# Patient Record
Sex: Female | Born: 1980
Health system: Southern US, Community
[De-identification: ages and names within clinical notes are randomized; demographics above are authoritative.]

## PROBLEM LIST (undated history)

## (undated) DIAGNOSIS — T8859XA Other complications of anesthesia, initial encounter: Secondary | ICD-10-CM

## (undated) DIAGNOSIS — R51 Headache: Secondary | ICD-10-CM

## (undated) DIAGNOSIS — R519 Headache, unspecified: Secondary | ICD-10-CM

## (undated) DIAGNOSIS — Z8751 Personal history of pre-term labor: Secondary | ICD-10-CM

## (undated) DIAGNOSIS — D649 Anemia, unspecified: Secondary | ICD-10-CM

## (undated) DIAGNOSIS — E7212 Methylenetetrahydrofolate reductase deficiency: Secondary | ICD-10-CM

## (undated) DIAGNOSIS — D6859 Other primary thrombophilia: Secondary | ICD-10-CM

## (undated) DIAGNOSIS — T4145XA Adverse effect of unspecified anesthetic, initial encounter: Secondary | ICD-10-CM

## (undated) DIAGNOSIS — T7840XA Allergy, unspecified, initial encounter: Secondary | ICD-10-CM

## (undated) DIAGNOSIS — O99280 Endocrine, nutritional and metabolic diseases complicating pregnancy, unspecified trimester: Secondary | ICD-10-CM

## (undated) DIAGNOSIS — O0990 Supervision of high risk pregnancy, unspecified, unspecified trimester: Secondary | ICD-10-CM

## (undated) DIAGNOSIS — O039 Complete or unspecified spontaneous abortion without complication: Secondary | ICD-10-CM

## (undated) HISTORY — DX: Supervision of high risk pregnancy, unspecified, unspecified trimester: O09.90

## (undated) HISTORY — PX: ARTHROSCOPIC REPAIR ACL: SUR80

## (undated) HISTORY — DX: Allergy, unspecified, initial encounter: T78.40XA

## (undated) HISTORY — DX: Complete or unspecified spontaneous abortion without complication: O03.9

## (undated) HISTORY — DX: Personal history of pre-term labor: Z87.51

---

## 2015-12-03 ENCOUNTER — Ambulatory Visit
Admission: EM | Admit: 2015-12-03 | Discharge: 2015-12-03 | Disposition: A | Discharge status: Home / Self Care | Payer: BLUE CROSS/BLUE SHIELD | Attending: Family Medicine | Admitting: Family Medicine

## 2015-12-03 ENCOUNTER — Encounter: Payer: Self-pay | Admitting: Emergency Medicine

## 2015-12-03 DIAGNOSIS — J018 Other acute sinusitis: Secondary | ICD-10-CM | POA: Diagnosis not present

## 2015-12-03 MED ORDER — AMOXICILLIN 500 MG PO CAPS: 500.0000 mg | ORAL_CAPSULE | Freq: Two times a day (BID) | ORAL | Status: AC

## 2015-12-03 MED ORDER — BUDESONIDE 32 MCG/ACT NA SUSP: 2.0000 | Freq: Every day | NASAL | Status: DC

## 2015-12-03 NOTE — ED Provider Notes (Signed)
CSN: [id number]     Arrival date & time 12/03/15  [id number] History   First MD Initiated Contact with Patient 12/03/15 0914     Chief Complaint  Patient presents with  . Facial Pain  . Nasal Congestion   (Consider location/radiation/quality/duration/timing/severity/associated sxs/prior Treatment) HPI: Patient presents today with symptoms of sinus congestion, sore throat and ear pressure. Patient states that she's had the symptoms for the last 3-4 days. She does have colored nasal drainage. She recently moved here from [place]. She denies any high fever, chest pain, shortness of breath, nausea, vomiting, abdominal pain, severe headache. She is currently breast-feeding. Her child has not been sick.  History reviewed. No pertinent past medical history. Past Surgical History  Procedure Laterality Date  . Cesarean section     History reviewed. No pertinent family history. Social History  Substance Use Topics  . Smoking status: Never Smoker   . Smokeless tobacco: None  . Alcohol Use: No   OB History    No data available     Review of Systems : Negative except mentioned above.   Allergies  Review of patient's allergies indicates no known allergies.  Home Medications   Prior to Admission medications   Not on File   Meds Ordered and Administered this Visit  Medications - No data to display  BP 136/98 mmHg  Pulse 94  Temp(Src) 97.4 F (36.3 C) (Tympanic)  Resp 16  Ht 5\' 6"  (1.676 m)  Wt 185 lb (83.915 kg)  BMI 29.87 kg/m2  SpO2 99% No data found.   Physical Exam   GENERAL: NAD HEENT: mild pharyngeal erythema, no exudate, no erythema of TMs, no cervical LAD, mild maxillary sinus tenderness bilaterally  RESP: CTA B CARD: RRR NEURO: CN II-XII grossly intact   ED Course  Procedures (including critical care time)  Labs Review Labs Reviewed - No data to display  Imaging Review No results found.     MDM  A/P: Sinusitis- will treat patient with Amoxicillin,  Claritin prn, Rhinocort Aqua prn, Tylenol prn, rest, hydration, seek medical attention if symptoms persist or worsen as discussed.    Jolene ProvostKirtida Mikael Debell, MD 12/03/15 1001

## 2015-12-03 NOTE — ED Notes (Signed)
Patient c/o sinus congestion and nasal congestion for the past 3 days.  Patient denies fevers.

## 2015-12-05 ENCOUNTER — Ambulatory Visit: Payer: Self-pay | Admitting: Family Medicine

## 2015-12-15 ENCOUNTER — Ambulatory Visit: Payer: Self-pay | Admitting: Internal Medicine

## 2015-12-17 ENCOUNTER — Encounter: Payer: Self-pay | Admitting: Internal Medicine

## 2015-12-17 ENCOUNTER — Ambulatory Visit (INDEPENDENT_AMBULATORY_CARE_PROVIDER_SITE_OTHER): Payer: BLUE CROSS/BLUE SHIELD | Admitting: Internal Medicine

## 2015-12-17 VITALS — BP 138/82 | HR 71 | Resp 16 | Ht 66.0 in | Wt 182.0 lb

## 2015-12-17 DIAGNOSIS — J3089 Other allergic rhinitis: Secondary | ICD-10-CM | POA: Diagnosis not present

## 2015-12-17 DIAGNOSIS — Z8751 Personal history of pre-term labor: Secondary | ICD-10-CM

## 2015-12-17 HISTORY — DX: Personal history of pre-term labor: Z87.51

## 2015-12-17 NOTE — Patient Instructions (Signed)
May take Claritin 10 mg daily.

## 2015-12-17 NOTE — Progress Notes (Signed)
    Date:  12/17/2015   Name:  Barbara GongSonya Schmidt   DOB:  [birth year]   MRN:  [id number]   Chief Complaint: Establish Care  New [place] from [place] for [place].  She is originally from [place].  She has no significant complaints today but needs to establish with PCP and high risk OB.  Allergies - she has notices some postnasal drainage and sneezing.  She is aware that she can take Claritin but has not tried it yet.  She is breast feeding her 3910 week old.  Pre-term labor/incompetent cervix/placental insufficiency - Pt reports cerclage and placental insufficiency with her first child who was born at 6136 weeks - after complete bedrest from 24 weeks. Her second child was born at 6532 weeks by C/S - that pregnancy was complicated by pre-term labor, clerclage and umbilical cord insufficiency.  Prior to these pregnancies, she had 2 miscarriages.  She is interested in one more child and wants to establish with a high risk OB clinic.   Review of Systems  Constitutional: Negative for fever, appetite change and fatigue.  HENT: Positive for sneezing.   Respiratory: Negative for cough, chest tightness, shortness of breath and wheezing.   Cardiovascular: Negative for chest pain, palpitations and leg swelling.  Gastrointestinal: Negative for abdominal pain.  Skin: Negative for rash and wound.  Neurological: Negative for dizziness and headaches.    Patient Active Problem List   Diagnosis Date Noted  . Environmental and seasonal allergies 12/17/2015  . H/O pre-term labor 12/17/2015    Prior to Admission medications   Not on File    No Known Allergies  Past Surgical History  Procedure Laterality Date  . Cesarean section  09/2015    1 vaginal and 1 c section  . Arthroscopic repair acl Right     high school     Social History  Substance Use Topics  . Smoking status: Never Smoker   . Smokeless tobacco: Never Used  . Alcohol Use: No    Medication list has been reviewed and updated.  Physical  Exam  Constitutional: She is oriented to person, place, and time. She appears well-developed. No distress.  HENT:  Head: Normocephalic and atraumatic.  Cardiovascular: Normal rate, regular rhythm and normal heart sounds.   Pulmonary/Chest: Effort normal and breath sounds normal. No respiratory distress.  Musculoskeletal: She exhibits no edema.  Neurological: She is alert and oriented to person, place, and time.  Skin: No rash noted.  Psychiatric: She has a normal mood and affect. Her behavior is normal. Thought content normal.  Nursing note and vitals reviewed.   BP 138/82 mmHg  Pulse 71  Resp 16  Ht 5\' 6"  (1.676 m)  Wt 182 lb (82.555 kg)  BMI 29.39 kg/m2  SpO2 97%  LMP 12/25/2014  Assessment and Plan: 1. Environmental and seasonal allergies May use Claritin as needed  2. H/O pre-term labor Recommend she establish with [facility] - if she needs a referral, she will call.   Bari EdwardLaura Mohmmad Saleeby, MD [facility]  12/17/2015

## 2016-01-13 DIAGNOSIS — Z3009 Encounter for other general counseling and advice on contraception: Secondary | ICD-10-CM | POA: Diagnosis not present

## 2016-01-13 DIAGNOSIS — Z87898 Personal history of other specified conditions: Secondary | ICD-10-CM | POA: Diagnosis not present

## 2016-01-13 DIAGNOSIS — R030 Elevated blood-pressure reading, without diagnosis of hypertension: Secondary | ICD-10-CM | POA: Diagnosis not present

## 2016-01-13 DIAGNOSIS — N883 Incompetence of cervix uteri: Secondary | ICD-10-CM | POA: Diagnosis not present

## 2016-01-19 ENCOUNTER — Ambulatory Visit: Payer: BLUE CROSS/BLUE SHIELD | Admitting: Internal Medicine

## 2016-01-19 ENCOUNTER — Encounter: Payer: Self-pay | Admitting: Internal Medicine

## 2016-01-19 ENCOUNTER — Ambulatory Visit (INDEPENDENT_AMBULATORY_CARE_PROVIDER_SITE_OTHER): Payer: BLUE CROSS/BLUE SHIELD | Admitting: Internal Medicine

## 2016-01-19 VITALS — BP 128/88 | HR 74 | Resp 16 | Ht 66.0 in | Wt 196.6 lb

## 2016-01-19 DIAGNOSIS — Z30011 Encounter for initial prescription of contraceptive pills: Secondary | ICD-10-CM | POA: Diagnosis not present

## 2016-01-19 DIAGNOSIS — Z021 Encounter for pre-employment examination: Secondary | ICD-10-CM | POA: Diagnosis not present

## 2016-01-19 MED ORDER — NORGESTIMATE-ETH ESTRADIOL 0.25-35 MG-MCG PO TABS: 1.0000 | ORAL_TABLET | Freq: Every day | ORAL | Status: DC

## 2016-01-19 NOTE — Progress Notes (Signed)
    Date:  01/19/2016   Name:  Barbara GongSonya Popoff   DOB:  1980-11-05   MRN:  [id number]   Chief Complaint: Employment Physical Patient has a form for school counselor position.  She had a Tdap in March after her baby was born.  She has had Hep B series but does not have the documentation with her.  She has records that show Rubella immunity from 2014. She has no complaints.  Currently weaning off of breast milk expressing (son won't nurse so she pumps). She is ready to resume OCP.  Her GYN appointment is in one month but does not want to wait.  She did not do well with Yaz or Ortho Tricyclen.  She liked Orthocyclen.   Review of Systems  Constitutional: Negative for fever, chills and fatigue.  Respiratory: Negative for cough, choking, chest tightness and shortness of breath.   Cardiovascular: Negative for chest pain, palpitations and leg swelling.  Gastrointestinal: Negative for abdominal pain.  Musculoskeletal: Negative for back pain.  Skin: Negative for color change and rash.  Neurological: Negative for dizziness and headaches.  Psychiatric/Behavioral: Negative for sleep disturbance.    Patient Active Problem List   Diagnosis Date Noted  . Blood pressure elevated without history of HTN 01/13/2016  . Environmental and seasonal allergies 12/17/2015  . H/O pre-term labor 12/17/2015    Prior to Admission medications   Medication Sig Start Date End Date Taking? Authorizing Provider  norgestimate-ethinyl estradiol (ORTHO-CYCLEN,SPRINTEC,PREVIFEM) 0.25-35 MG-MCG tablet Take 1 tablet by mouth daily. 01/19/16   Reubin MilanLaura H Gregg Holster, MD    No Known Allergies  Past Surgical History  Procedure Laterality Date  . Cesarean section  09/2015    1 vaginal and 1 c section  . Arthroscopic repair acl Right     high school     Social History  Substance Use Topics  . Smoking status: Never Smoker   . Smokeless tobacco: Never Used  . Alcohol Use: No     Medication list has been reviewed and  updated.   Physical Exam  Constitutional: She is oriented to person, place, and time. She appears well-developed. No distress.  HENT:  Head: Normocephalic and atraumatic.  Neck: Normal range of motion. Neck supple. No thyromegaly present.  Cardiovascular: Normal rate, regular rhythm and normal heart sounds.   Pulmonary/Chest: Effort normal and breath sounds normal. No respiratory distress. She has no wheezes. She has no rales.  Musculoskeletal: Normal range of motion. She exhibits no edema or tenderness.  Neurological: She is alert and oriented to person, place, and time.  Skin: Skin is warm and dry. No rash noted.  Psychiatric: She has a normal mood and affect. Her behavior is normal. Thought content normal.  Nursing note and vitals reviewed.   BP 128/88 mmHg  Pulse 74  Resp 16  Ht 5\' 6"  (1.676 m)  Wt 196 lb 9.6 oz (89.177 kg)  BMI 31.75 kg/m2  SpO2 99%  Assessment and Plan: 1. Physical exam, pre-employment Form completed TDap up to date She will provide Hep B documentation PPD not required  2. Encounter for initial prescription of contraceptive pills Follow up with GYN - norgestimate-ethinyl estradiol (ORTHO-CYCLEN,SPRINTEC,PREVIFEM) 0.25-35 MG-MCG tablet; Take 1 tablet by mouth daily.  Dispense: 1 Package; Refill: 11   Bari EdwardLaura Korra Christine, MD [facility]  01/19/2016

## 2016-01-19 NOTE — Patient Instructions (Signed)
Tdap Vaccine (Tetanus, Diphtheria and Pertussis): What You Need to Know 1. Why get vaccinated? Tetanus, diphtheria and pertussis are very serious diseases. Tdap vaccine can protect us from these diseases. And, Tdap vaccine given to pregnant women can protect newborn babies against pertussis. TETANUS (Lockjaw) is rare in the [place] today. It causes painful muscle tightening and stiffness, usually all over the body.  It can lead to tightening of muscles in the head and neck so you can't open your mouth, swallow, or sometimes even breathe. Tetanus kills about 1 out of 10 people who are infected even after receiving the best medical care. DIPHTHERIA is also rare in the [place] today. It can cause a thick coating to form in the back of the throat.  It can lead to breathing problems, heart failure, paralysis, and death. PERTUSSIS (Whooping Cough) causes severe coughing spells, which can cause difficulty breathing, vomiting and disturbed sleep.  It can also lead to weight loss, incontinence, and rib fractures. Up to 2 in 100 adolescents and 5 in 100 adults with pertussis are hospitalized or have complications, which could include pneumonia or death. These diseases are caused by bacteria. Diphtheria and pertussis are spread from person to person through secretions from coughing or sneezing. Tetanus enters the body through cuts, scratches, or wounds. Before vaccines, as many as 200,000 cases of diphtheria, 200,000 cases of pertussis, and hundreds of cases of tetanus, were reported in the [place] each year. Since vaccination began, reports of cases for tetanus and diphtheria have dropped by about 99% and for pertussis by about 80%. 2. Tdap vaccine Tdap vaccine can protect adolescents and adults from tetanus, diphtheria, and pertussis. One dose of Tdap is routinely given at age 11 or 12. People who did not get Tdap at that age should get it as soon as possible. Tdap is especially important  for healthcare professionals and anyone having close contact with a baby younger than 12 months. Pregnant women should get a dose of Tdap during every pregnancy, to protect the newborn from pertussis. Infants are most at risk for severe, life-threatening complications from pertussis. Another vaccine, called Td, protects against tetanus and diphtheria, but not pertussis. A Td booster should be given every 10 years. Tdap may be given as one of these boosters if you have never gotten Tdap before. Tdap may also be given after a severe cut or burn to prevent tetanus infection. Your doctor or the person giving you the vaccine can give you more information. Tdap may safely be given at the same time as other vaccines. 3. Some people should not get this vaccine  A person who has ever had a life-threatening allergic reaction after a previous dose of any diphtheria, tetanus or pertussis containing vaccine, OR has a severe allergy to any part of this vaccine, should not get Tdap vaccine. Tell the person giving the vaccine about any severe allergies.  Anyone who had coma or long repeated seizures within 7 days after a childhood dose of DTP or DTaP, or a previous dose of Tdap, should not get Tdap, unless a cause other than the vaccine was found. They can still get Td.  Talk to your doctor if you:  have seizures or another nervous system problem,  had severe pain or swelling after any vaccine containing diphtheria, tetanus or pertussis,  ever had a condition called Guillain-Barr Syndrome (GBS),  aren't feeling well on the day the shot is scheduled. 4. Risks With any medicine, including vaccines, there is   a chance of side effects. These are usually mild and go away on their own. Serious reactions are also possible but are rare. Most people who get Tdap vaccine do not have any problems with it. Mild problems following Tdap (Did not interfere with activities)  Pain where the shot was given (about 3 in 4  adolescents or 2 in 3 adults)  Redness or swelling where the shot was given (about 1 person in 5)  Mild fever of at least 100.4F (up to about 1 in 25 adolescents or 1 in 100 adults)  Headache (about 3 or 4 people in 10)  Tiredness (about 1 person in 3 or 4)  Nausea, vomiting, diarrhea, stomach ache (up to 1 in 4 adolescents or 1 in 10 adults)  Chills, sore joints (about 1 person in 10)  Body aches (about 1 person in 3 or 4)  Rash, swollen glands (uncommon) Moderate problems following Tdap (Interfered with activities, but did not require medical attention)  Pain where the shot was given (up to 1 in 5 or 6)  Redness or swelling where the shot was given (up to about 1 in 16 adolescents or 1 in 12 adults)  Fever over 102F (about 1 in 100 adolescents or 1 in 250 adults)  Headache (about 1 in 7 adolescents or 1 in 10 adults)  Nausea, vomiting, diarrhea, stomach ache (up to 1 or 3 people in 100)  Swelling of the entire arm where the shot was given (up to about 1 in 500). Severe problems following Tdap (Unable to perform usual activities; required medical attention)  Swelling, severe pain, bleeding and redness in the arm where the shot was given (rare). Problems that could happen after any vaccine:  People sometimes faint after a medical procedure, including vaccination. Sitting or lying down for about 15 minutes can help prevent fainting, and injuries caused by a fall. Tell your doctor if you feel dizzy, or have vision changes or ringing in the ears.  Some people get severe pain in the shoulder and have difficulty moving the arm where a shot was given. This happens very rarely.  Any medication can cause a severe allergic reaction. Such reactions from a vaccine are very rare, estimated at fewer than 1 in a million doses, and would happen within a few minutes to a few hours after the vaccination. As with any medicine, there is a very remote chance of a vaccine causing a serious  injury or death. The safety of vaccines is always being monitored. For more information, visit: www.cdc.gov/vaccinesafety/ 5. What if there is a serious problem? What should I look for?  Look for anything that concerns you, such as signs of a severe allergic reaction, very high fever, or unusual behavior.  Signs of a severe allergic reaction can include hives, swelling of the face and throat, difficulty breathing, a fast heartbeat, dizziness, and weakness. These would usually start a few minutes to a few hours after the vaccination. What should I do?  If you think it is a severe allergic reaction or other emergency that can't wait, call 9-1-1 or get the person to the nearest hospital. Otherwise, call your doctor.  Afterward, the reaction should be reported to the Vaccine Adverse Event Reporting System (VAERS). Your doctor might file this report, or you can do it yourself through the VAERS web site at www.vaers.hhs.gov, or by calling 1-800-822-7967. VAERS does not give medical advice.  6. The National Vaccine Injury Compensation Program The National Vaccine Injury Compensation Program (  VICP) is a federal program that was created to compensate people who may have been injured by certain vaccines. Persons who believe they may have been injured by a vaccine can learn about the program and about filing a claim by calling 1-800-338-2382 or visiting the VICP website at www.hrsa.gov/vaccinecompensation. There is a time limit to file a claim for compensation. 7. How can I learn more?  Ask your doctor. He or she can give you the vaccine package insert or suggest other sources of information.  Call your local or state health department.  Contact the Centers for Disease Control and Prevention (CDC):  Call 1-800-232-4636 (1-800-CDC-INFO) or  Visit CDC's website at www.cdc.gov/vaccines CDC Tdap Vaccine VIS (09/18/13)   This information is not intended to replace advice given to you by your health care  provider. Make sure you discuss any questions you have with your health care provider.   Document Released: 01/11/2012 Document Revised: 08/02/2014 Document Reviewed: 10/24/2013 Elsevier Interactive Patient Education 2016 Elsevier Inc.  

## 2016-02-24 ENCOUNTER — Other Ambulatory Visit: Payer: Self-pay

## 2016-02-24 DIAGNOSIS — Z30011 Encounter for initial prescription of contraceptive pills: Secondary | ICD-10-CM

## 2016-02-24 MED ORDER — NORGESTIMATE-ETH ESTRADIOL 0.25-35 MG-MCG PO TABS: 1.0000 | ORAL_TABLET | Freq: Every day | ORAL | 3 refills | Status: DC

## 2016-03-08 ENCOUNTER — Other Ambulatory Visit: Payer: Self-pay | Admitting: Internal Medicine

## 2016-03-08 DIAGNOSIS — Z30011 Encounter for initial prescription of contraceptive pills: Secondary | ICD-10-CM

## 2016-03-08 MED ORDER — NORGESTIMATE-ETH ESTRADIOL 0.25-35 MG-MCG PO TABS
1.0000 | ORAL_TABLET | Freq: Every day | ORAL | 3 refills | Status: DC
Start: 2016-03-08 — End: 2016-07-27

## 2016-04-18 ENCOUNTER — Encounter: Payer: Self-pay | Admitting: Emergency Medicine

## 2016-04-18 ENCOUNTER — Ambulatory Visit
Admission: EM | Admit: 2016-04-18 | Discharge: 2016-04-18 | Disposition: A | Discharge status: Home / Self Care | Payer: BLUE CROSS/BLUE SHIELD | Attending: Family Medicine | Admitting: Family Medicine

## 2016-04-18 DIAGNOSIS — N390 Urinary tract infection, site not specified: Secondary | ICD-10-CM | POA: Diagnosis not present

## 2016-04-18 LAB — URINALYSIS COMPLETE WITH MICROSCOPIC (ARMC ONLY)
BILIRUBIN URINE: NEGATIVE
GLUCOSE, UA: NEGATIVE mg/dL
Hgb urine dipstick: NEGATIVE
KETONES UR: NEGATIVE mg/dL
Leukocytes, UA: NEGATIVE
NITRITE: NEGATIVE
Protein, ur: NEGATIVE mg/dL
SPECIFIC GRAVITY, URINE: 1.025 (ref 1.005–1.030)
pH: 5.5 (ref 5.0–8.0)

## 2016-04-18 MED ORDER — CIPROFLOXACIN HCL 500 MG PO TABS: 500.0000 mg | ORAL_TABLET | Freq: Two times a day (BID) | ORAL | 0 refills | Status: DC

## 2016-04-18 NOTE — ED Triage Notes (Signed)
Patient c/o burning when urinating and urinary frequency for 2 days. Patient denies fevers.

## 2016-04-18 NOTE — ED Provider Notes (Signed)
[facility]    CSN: [id number] Arrival date & time: 04/18/16  1344  First Provider Contact:  None       History   Chief Complaint Chief Complaint  Patient presents with  . Dysuria    HPI Barbara Schmidt is a 35 y.o. female.   The history is provided by the patient.  Dysuria  Pain quality:  Burning and aching Pain severity:  Mild Onset quality:  Sudden Duration:  2 days Timing:  Constant Progression:  Worsening Chronicity:  New Recent urinary tract infections: no   Relieved by:  Nothing Ineffective treatments:  None tried Urinary symptoms: discolored urine and frequent urination   Urinary symptoms: no foul-smelling urine, no hematuria, no hesitancy and no bladder incontinence   Associated symptoms: abdominal pain (suprapubic)   Associated symptoms: no fever, no flank pain, no genital lesions, no nausea, no vaginal discharge and no vomiting   Risk factors: no hx of pyelonephritis, no hx of urolithiasis, no kidney transplant, not pregnant, no recurrent urinary tract infections, no renal cysts, no renal disease, not sexually active, no sexually transmitted infections and no single kidney     Past Medical History:  Diagnosis Date  . Allergy   . High-risk pregnancy   . Miscarriage    2  . Vaginal delivery     Patient Active Problem List   Diagnosis Date Noted  . Blood pressure elevated without history of HTN 01/13/2016  . Environmental and seasonal allergies 12/17/2015  . H/O pre-term labor 12/17/2015    Past Surgical History:  Procedure Laterality Date  . ARTHROSCOPIC REPAIR ACL Right    high school   . CESAREAN SECTION  09/2015   1 vaginal and 1 c section    OB History    No data available       Home Medications    Prior to Admission medications   Medication Sig Start Date End Date Taking? Authorizing Provider  ciprofloxacin (CIPRO) 500 MG tablet Take 1 tablet (500 mg total) by mouth every 12 (twelve) hours. 04/18/16   Payton Mccallum, MD    norgestimate-ethinyl estradiol (ORTHO-CYCLEN,SPRINTEC,PREVIFEM) 0.25-35 MG-MCG tablet Take 1 tablet by mouth daily. 03/08/16   Reubin Milan, MD    Family History Family History  Problem Relation Age of Onset  . Diabetes Maternal Grandmother   . Diabetes Paternal Uncle   . Kidney disease Paternal Uncle     Social History Social History  Substance Use Topics  . Smoking status: Never Smoker  . Smokeless tobacco: Never Used  . Alcohol use No     Allergies   Review of patient's allergies indicates no known allergies.   Review of Systems Review of Systems  Constitutional: Negative for fever.  Gastrointestinal: Positive for abdominal pain (suprapubic). Negative for nausea and vomiting.  Genitourinary: Positive for dysuria. Negative for flank pain and vaginal discharge.     Physical Exam Triage Vital Signs ED Triage Vitals  Enc Vitals Group     BP 04/18/16 1422 123/79     Pulse Rate 04/18/16 1422 68     Resp 04/18/16 1422 16     Temp 04/18/16 1422 97.3 F (36.3 C)     Temp Source 04/18/16 1422 Tympanic     SpO2 04/18/16 1422 100 %     Weight 04/18/16 1423 185 lb (83.9 kg)     Height 04/18/16 1423 5\' 6"  (1.676 m)     Head Circumference --      Peak Flow --  Pain Score 04/18/16 1424 5     Pain Loc --      Pain Edu? --      Excl. in GC? --    No data found.   Updated Vital Signs BP 123/79 (BP Location: Right Arm)   Pulse 68   Temp 97.3 F (36.3 C) (Tympanic)   Resp 16   Ht 5\' 6"  (1.676 m)   Wt 185 lb (83.9 kg)   LMP 03/25/2016 (Approximate)   SpO2 100%   BMI 29.86 kg/m   Visual Acuity Right Eye Distance:   Left Eye Distance:   Bilateral Distance:    Right Eye Near:   Left Eye Near:    Bilateral Near:     Physical Exam  Constitutional: She appears well-developed and well-nourished. No distress.  Abdominal: Soft. Bowel sounds are normal. She exhibits no distension and no mass. There is tenderness (mild, suprapubic). There is no rebound and no  guarding. No hernia.  Skin: She is not diaphoretic.  Nursing note and vitals reviewed.    UC Treatments / Results  Labs (all labs ordered are listed, but only abnormal results are displayed) Labs Reviewed  URINALYSIS COMPLETEWITH MICROSCOPIC ([facility] ONLY) - Abnormal; Notable for the following:       Result Value   APPearance HAZY (*)    Bacteria, UA FEW (*)    Squamous Epithelial / LPF 6-30 (*)    All other components within normal limits  URINE CULTURE    EKG  EKG Interpretation None       Radiology No results found.  Procedures Procedures (including critical care time)  Medications Ordered in UC Medications - No data to display   Initial Impression / Assessment and Plan / UC Course  I have reviewed the triage vital signs and the nursing notes.  Pertinent labs & imaging results that were available during my care of the patient were reviewed by me and considered in my medical decision making (see chart for details).  Clinical Course      Final Clinical Impressions(s) / UC Diagnoses   Final diagnoses:  UTI (lower urinary tract infection)    New Prescriptions Discharge Medication List as of 04/18/2016  3:37 PM    START taking these medications   Details  ciprofloxacin (CIPRO) 500 MG tablet Take 1 tablet (500 mg total) by mouth every 12 (twelve) hours., Starting Sun 04/18/2016, Normal       1. Labs/x-ray results and diagnosis reviewed with patient/parent/guardian/family 2. rx as per orders above; reviewed possible side effects, interactions, risks and benefits  3. Recommend supportive treatment with increased fluids 4. Follow-up prn if symptoms worsen or don't improve   Payton Mccallumrlando Anddy Wingert, MD 04/20/16 2205

## 2016-04-20 ENCOUNTER — Telehealth: Payer: Self-pay

## 2016-04-20 LAB — URINE CULTURE: Culture: >=100000 — AB

## 2016-04-20 NOTE — Telephone Encounter (Signed)
Courtesy call back completed today for patients recent visit at [facility]. Patient did not answer, left message on voicemail to call back with any questions or concerns.   

## 2016-04-28 DIAGNOSIS — Z3169 Encounter for other general counseling and advice on procreation: Secondary | ICD-10-CM | POA: Diagnosis not present

## 2016-04-28 DIAGNOSIS — R300 Dysuria: Secondary | ICD-10-CM | POA: Diagnosis not present

## 2016-04-28 DIAGNOSIS — Z30011 Encounter for initial prescription of contraceptive pills: Secondary | ICD-10-CM | POA: Diagnosis not present

## 2016-05-22 ENCOUNTER — Ambulatory Visit
Admission: EM | Admit: 2016-05-22 | Discharge: 2016-05-22 | Disposition: A | Discharge status: Home / Self Care | Payer: BLUE CROSS/BLUE SHIELD | Attending: Family Medicine | Admitting: Family Medicine

## 2016-05-22 DIAGNOSIS — H6593 Unspecified nonsuppurative otitis media, bilateral: Secondary | ICD-10-CM

## 2016-05-22 DIAGNOSIS — J0101 Acute recurrent maxillary sinusitis: Secondary | ICD-10-CM | POA: Diagnosis not present

## 2016-05-22 DIAGNOSIS — J301 Allergic rhinitis due to pollen: Secondary | ICD-10-CM

## 2016-05-22 DIAGNOSIS — B001 Herpesviral vesicular dermatitis: Secondary | ICD-10-CM | POA: Diagnosis not present

## 2016-05-22 MED ORDER — LORATADINE 10 MG PO TABS: 10.0000 mg | ORAL_TABLET | Freq: Every day | ORAL | 0 refills | Status: DC

## 2016-05-22 MED ORDER — PENCICLOVIR 1 % EX CREA: 1.0000 "application " | TOPICAL_CREAM | CUTANEOUS | 0 refills | Status: DC

## 2016-05-22 MED ORDER — SALINE SPRAY 0.65 % NA SOLN: 2.0000 | NASAL | 0 refills | Status: DC

## 2016-05-22 MED ORDER — AMOXICILLIN 875 MG PO TABS: 875.0000 mg | ORAL_TABLET | Freq: Two times a day (BID) | ORAL | 0 refills | Status: AC

## 2016-05-22 MED ORDER — FLUTICASONE PROPIONATE 50 MCG/ACT NA SUSP: 1.0000 | Freq: Two times a day (BID) | NASAL | 0 refills | Status: DC

## 2016-05-22 MED ORDER — IBUPROFEN 800 MG PO TABS: 800.0000 mg | ORAL_TABLET | Freq: Three times a day (TID) | ORAL | 0 refills | Status: DC

## 2016-05-22 NOTE — ED Provider Notes (Signed)
CSN: [id number]     Arrival date & time 05/22/16  4098 History   First MD Initiated Contact with Patient 05/22/16 [phone]     Chief Complaint  Patient presents with  . Cough    ear pain, sinus infection, HA mild, nasal drainage sore throat onset 3 days pt has Hx of less white cell count as per patient   (Consider location/radiation/quality/duration/timing/severity/associated sxs/prior Treatment) Married african [place] female here for sinus infection, sore throat, nasal congestion.  Last seen by Dr Allena Katz May 2017 for sinus infection amoxicillin worked well.  Hasn't been taking anything for allergies, son sick also 79 month old nasal discharge x 2 months.  Works as Veterinary surgeon with high school students.  Also requesting refill Denavir topical ran out and having symptoms of cold sore right upper lip.  "Typically happens if I have runny nose"  Has neti pot at home not currently using.  Not taking any allergy medications.    PCM Dr Judithann Graves      Past Medical History:  Diagnosis Date  . Allergy   . High-risk pregnancy   . Miscarriage    2  . Vaginal delivery    Past Surgical History:  Procedure Laterality Date  . ARTHROSCOPIC REPAIR ACL Right    high school   . CESAREAN SECTION  09/2015   1 vaginal and 1 c section   Family History  Problem Relation Age of Onset  . Diabetes Maternal Grandmother   . Diabetes Paternal Uncle   . Kidney disease Paternal Uncle    Social History  Substance Use Topics  . Smoking status: Never Smoker  . Smokeless tobacco: Never Used  . Alcohol use No   OB History    No data available     Review of Systems  Constitutional: Negative for activity change, appetite change, chills, diaphoresis, fatigue, fever and unexpected weight change.  HENT: Positive for congestion, postnasal drip and sinus pressure. Negative for dental problem, drooling, ear discharge, ear pain, facial swelling, hearing loss, mouth sores, nosebleeds, rhinorrhea, sneezing, sore throat,  tinnitus, trouble swallowing and voice change.   Eyes: Negative for photophobia, pain, discharge, redness, itching and visual disturbance.  Respiratory: Negative for cough, choking, chest tightness, shortness of breath, wheezing and stridor.   Cardiovascular: Negative for chest pain, palpitations and leg swelling.  Gastrointestinal: Negative for abdominal distention, abdominal pain, blood in stool, constipation, diarrhea, nausea and vomiting.  Endocrine: Negative for cold intolerance and heat intolerance.  Genitourinary: Negative for difficulty urinating, dysuria and hematuria.  Musculoskeletal: Negative for arthralgias, back pain, gait problem, joint swelling, myalgias, neck pain and neck stiffness.  Skin: Negative for color change, pallor, rash and wound.  Allergic/Immunologic: Positive for environmental allergies. Negative for food allergies.  Neurological: Negative for dizziness, tremors, seizures, syncope, facial asymmetry, speech difficulty, weakness, light-headedness, numbness and headaches.  Hematological: Negative for adenopathy. Does not bruise/bleed easily.  Psychiatric/Behavioral: Negative for agitation, behavioral problems, confusion and sleep disturbance.    Allergies  Review of patient's allergies indicates no known allergies.  Home Medications   Prior to Admission medications   Medication Sig Start Date End Date Taking? Authorizing Provider  norgestimate-ethinyl estradiol (ORTHO-CYCLEN,SPRINTEC,PREVIFEM) 0.25-35 MG-MCG tablet Take 1 tablet by mouth daily. 03/08/16  Yes Reubin Milan, MD  amoxicillin (AMOXIL) 875 MG tablet Take 1 tablet (875 mg total) by mouth 2 (two) times daily. 05/22/16 06/01/16  Barbaraann Barthel, NP  fluticasone (FLONASE) 50 MCG/ACT nasal spray Place 1 spray into both nostrils 2 (two) times daily.  05/22/16   Barbaraann Barthelina A Betancourt, NP  ibuprofen (ADVIL,MOTRIN) 800 MG tablet Take 1 tablet (800 mg total) by mouth 3 (three) times daily. 05/22/16   Barbaraann Barthelina A  Betancourt, NP  loratadine (CLARITIN) 10 MG tablet Take 1 tablet (10 mg total) by mouth daily. 05/22/16 06/21/16  Barbaraann Barthelina A Betancourt, NP  penciclovir (DENAVIR) 1 % cream Apply 1 application topically every 2 (two) hours. 05/22/16   Barbaraann Barthelina A Betancourt, NP  sodium chloride (OCEAN) 0.65 % SOLN nasal spray Place 2 sprays into both nostrils every 2 (two) hours while awake. 05/22/16 06/21/16  Barbaraann Barthelina A Betancourt, NP   Meds Ordered and Administered this Visit  Medications - No data to display  BP 127/76 (BP Location: Right Arm)   Pulse 83   Temp 98.3 F (36.8 C) (Oral)   Resp 16   Ht 5\' 6"  (1.676 m)   Wt 185 lb (83.9 kg)   SpO2 100%   BMI 29.86 kg/m  No data found.   Physical Exam  Constitutional: She is oriented to person, place, and time. Vital signs are normal. She appears well-developed and well-nourished. She is active and cooperative.  Non-toxic appearance. She does not have a sickly appearance. She does not appear ill. No distress.  HENT:  Head: Normocephalic and atraumatic.  Right Ear: Hearing, external ear and ear canal normal. A middle ear effusion is present.  Left Ear: Hearing, external ear and ear canal normal. A middle ear effusion is present.  Nose: Mucosal edema and rhinorrhea present. No nose lacerations, sinus tenderness, nasal deformity, septal deviation or nasal septal hematoma. No epistaxis.  No foreign bodies. Right sinus exhibits maxillary sinus tenderness and frontal sinus tenderness. Left sinus exhibits maxillary sinus tenderness and frontal sinus tenderness.  Mouth/Throat: Uvula is midline and mucous membranes are normal. Mucous membranes are not pale, not dry and not cyanotic. She does not have dentures. No oral lesions. No trismus in the jaw. Normal dentition. No dental abscesses, uvula swelling, lacerations or dental caries. Posterior oropharyngeal edema and posterior oropharyngeal erythema present. No oropharyngeal exudate or tonsillar abscesses. Tonsils are 1+ on the  right. Tonsils are 1+ on the left. No tonsillar exudate.  Cobblestoning posterior pharynx; tonsils 1+/4 bilaterally erythema no exudate; macular erythema oropharynx; bilateral allergic shiners; bilateral nasal turbinates edema/erythema/white discharge; bilateral TMs air fluid level clear  Eyes: Conjunctivae, EOM and lids are normal. Pupils are equal, round, and reactive to light. Right eye exhibits no chemosis, no discharge, no exudate and no hordeolum. No foreign body present in the right eye. Left eye exhibits no chemosis, no discharge, no exudate and no hordeolum. No foreign body present in the left eye. Right conjunctiva is not injected. Right conjunctiva has no hemorrhage. Left conjunctiva is not injected. Left conjunctiva has no hemorrhage. No scleral icterus. Right eye exhibits normal extraocular motion and no nystagmus. Left eye exhibits normal extraocular motion and no nystagmus. Right pupil is round and reactive. Left pupil is round and reactive. Pupils are equal.  Neck: Trachea normal, normal range of motion and phonation normal. Neck supple. No tracheal tenderness, no spinous process tenderness and no muscular tenderness present. No neck rigidity. No tracheal deviation, no edema, no erythema and normal range of motion present. No thyroid mass and no thyromegaly present.  Cardiovascular: Normal rate, regular rhythm, S1 normal, S2 normal, normal heart sounds and intact distal pulses.  PMI is not displaced.  Exam reveals no gallop and no friction rub.   No murmur heard. Pulmonary/Chest: Effort normal and  breath sounds normal. No accessory muscle usage or stridor. No respiratory distress. She has no decreased breath sounds. She has no wheezes. She has no rhonchi. She has no rales. She exhibits no tenderness.  Abdominal: Soft. She exhibits no distension.  Musculoskeletal: Normal range of motion. She exhibits no edema or tenderness.       Right shoulder: Normal.       Left shoulder: Normal.        Right hip: Normal.       Left hip: Normal.       Right knee: Normal.       Left knee: Normal.       Cervical back: Normal.       Right hand: Normal.       Left hand: Normal.  Lymphadenopathy:       Head (right side): No submental, no submandibular, no tonsillar, no preauricular, no posterior auricular and no occipital adenopathy present.       Head (left side): No submental, no submandibular, no tonsillar, no preauricular, no posterior auricular and no occipital adenopathy present.    She has no cervical adenopathy.       Right cervical: No superficial cervical, no deep cervical and no posterior cervical adenopathy present.      Left cervical: No superficial cervical, no deep cervical and no posterior cervical adenopathy present.  Neurological: She is alert and oriented to person, place, and time. She has normal strength. She is not disoriented. She displays no atrophy and no tremor. No cranial nerve deficit or sensory deficit. She exhibits normal muscle tone. She displays no seizure activity. Coordination and gait normal. GCS eye subscore is 4. GCS verbal subscore is 5. GCS motor subscore is 6.  Skin: Skin is warm, dry and intact. Capillary refill takes less than 2 seconds. No abrasion, no bruising, no burn, no ecchymosis, no laceration, no lesion, no petechiae and no rash noted. She is not diaphoretic. No cyanosis or erythema. No pallor. Nails show no clubbing.  Psychiatric: She has a normal mood and affect. Her speech is normal and behavior is normal. Judgment and thought content normal. She is not actively hallucinating. Cognition and memory are normal. She is attentive.  Nursing note and vitals reviewed.   Urgent Care Course   Clinical Course    Procedures (including critical care time)  Labs Review Labs Reviewed - No data to display  Imaging Review No results found.      MDM   1. Acute recurrent maxillary sinusitis   2. Recurrent cold sores   3. Acute nonseasonal  allergic rhinitis due to pollen   4. Otitis media with effusion, bilateral    Patient may use normal saline nasal spray as needed.  Consider antihistamine (claritin 10mg  po daily) or nasal steroid use flonase 1 spray each nostril BID.  Avoid triggers if possible.  Shower prior to bedtime if exposed to triggers.  If allergic dust/dust mites recommend mattress/pillow covers/encasements; washing linens, vacuuming, sweeping, dusting weekly.  Call or return to clinic as needed if these symptoms worsen or fail to improve as anticipated.   Exitcare handout on allergic rhinitis given to patient.  Patient verbalized understanding of instructions, agreed with plan of care and had no further questions at this time.  P2:  Avoidance and hand washing.  Supportive treatment.   No evidence of invasive bacterial infection, non toxic and well hydrated.  This is most likely self limiting viral infection.  I do not see where any further  testing or imaging is necessary at this time.   I will suggest supportive care, rest, good hygiene and encourage the patient to take adequate fluids.  The patient is to return to clinic or EMERGENCY ROOM if symptoms worsen or change significantly e.g. ear pain, fever, purulent discharge from ears or bleeding.  Exitcare handout on otitis media with effusion given to patient.  Patient verbalized agreement and understanding of treatment plan.      Suspect Viral illness: no evidence of invasive bacterial infection, non toxic and well hydrated.  This is most likely self limiting viral infection.  I do not see where any further testing or imaging is necessary at this time.   I will suggest supportive care, rest, good hygiene and encourage the patient to take adequate fluids.  Does not require work excuse.   flonase 1 spray each nostril BID prn, nasal saline 1-2 sprays each nostril prn q2h, motrin 800mg  po TID prn.  Discussed honey with lemon and salt water gargles for comfort also.  The patient is to  return to clinic or EMERGENCY ROOM if symptoms worsen or change significantly e.g. fever, lethargy, SOB, wheezing.  Exitcare handout on viral illness given to patient.  Patient verbalized agreement and understanding of treatment plan.     Neti pot or saline BID.  Restart flonase 1 spray each nostril BID, saline 2 sprays each nostril q2h prn congestion.  If no improvement with 48 hours of saline and flonase use start amoxicillin 875mg  po BID x 10 days.  Rx given.  No evidence of systemic bacterial infection, non toxic and well hydrated.  I do not see where any further testing or imaging is necessary at this time.   I will suggest supportive care, rest, good hygiene and encourage the patient to take adequate fluids.  The patient is to return to clinic or EMERGENCY ROOM if symptoms worsen or change significantly.  Exitcare handout on sinusitis given to patient.  Patient verbalized agreement and understanding of treatment plan and had no further questions at this time.   P2:  Hand washing and cover cough  Didn't need work note.  Amoxicillin prescribed for sinusitis also covers strep.  Suspect post nasal drip throat pain. Usually no specific medical treatment is needed if a virus is causing the sore throat.  The throat most often gets better on its own within 5 to 7 days.  Antibiotic medicine does not cure viral pharyngitis.   For acute pharyngitis caused by bacteria, your healthcare provider will prescribe an antibiotic.  Marland Kitchen. Do not smoke.  Marland Kitchen. Avoid secondhand smoke and other air pollutants.  . Use a cool mist humidifier to add moisture to the air.  . Get plenty of rest.  . You may want to rest your throat by talking less and eating a diet that is mostly liquid or soft for a day or two.   Marland Kitchen. Nonprescription throat lozenges and mouthwashes should help relieve the soreness.   . Gargling with warm saltwater and drinking warm liquids may help.  (You can make a saltwater solution by adding 1/4 teaspoon of salt to 8  ounces, or 240 mL, of warm water.)  . A nonprescription pain reliever such as aspirin, acetaminophen, or ibuprofen may ease general aches and pains.   FOLLOW UP with clinic provider if no improvements in the next 7-10 days.  Patient verbalized understanding of instructions and agreed with plan of care. P2:  Hand washing and diet.  Discussed with patient to not  share eating utensils, oral hygiene products.  Wash hands frequently.  Wash dishes in hot water or dishwasher if available.  Do not touch sores.  Recommended no oral sex or kissing until lesions resolved completely.  Discussed with patient viral shedding occurs even after lesion healed and is possible to transmit infection to close oral contact partners.  Patient opted to continue topical antiviral treatment today. Refilled denavir 1% cream apply q2h wa x 4 days #1 RF0 Exitcare handout on herpes simplex I lesions given to patient.  Patient verbalized understanding of information, agreed with plan of care and had no further questions at this time.   P2 handwashing, avoid oral contact with others, do not share eating or oral hygiene utensils.     Barbaraann Barthel, NP 05/22/16 [phone]

## 2016-07-27 ENCOUNTER — Telehealth: Payer: Self-pay | Admitting: Internal Medicine

## 2016-07-27 ENCOUNTER — Ambulatory Visit (INDEPENDENT_AMBULATORY_CARE_PROVIDER_SITE_OTHER): Payer: BLUE CROSS/BLUE SHIELD | Admitting: Internal Medicine

## 2016-07-27 ENCOUNTER — Encounter: Payer: Self-pay | Admitting: Internal Medicine

## 2016-07-27 ENCOUNTER — Other Ambulatory Visit: Payer: Self-pay | Admitting: Internal Medicine

## 2016-07-27 VITALS — BP 136/78 | HR 88 | Temp 98.6°F | Ht 66.0 in | Wt 186.0 lb

## 2016-07-27 DIAGNOSIS — R300 Dysuria: Secondary | ICD-10-CM | POA: Diagnosis not present

## 2016-07-27 DIAGNOSIS — E7212 Methylenetetrahydrofolate reductase deficiency: Secondary | ICD-10-CM

## 2016-07-27 DIAGNOSIS — D6859 Other primary thrombophilia: Secondary | ICD-10-CM | POA: Insufficient documentation

## 2016-07-27 DIAGNOSIS — J0100 Acute maxillary sinusitis, unspecified: Secondary | ICD-10-CM

## 2016-07-27 DIAGNOSIS — L723 Sebaceous cyst: Secondary | ICD-10-CM | POA: Diagnosis not present

## 2016-07-27 DIAGNOSIS — E7211 Homocystinuria: Secondary | ICD-10-CM | POA: Insufficient documentation

## 2016-07-27 LAB — POCT URINALYSIS DIPSTICK
Bilirubin, UA: NEGATIVE
Blood, UA: NEGATIVE
Glucose, UA: NEGATIVE
Ketones, UA: NEGATIVE
LEUKOCYTES UA: NEGATIVE
NITRITE UA: NEGATIVE
PROTEIN UA: NEGATIVE
SPEC GRAV UA: 1.01
UROBILINOGEN UA: 0.2
pH, UA: 7

## 2016-07-27 MED ORDER — SULFAMETHOXAZOLE-TRIMETHOPRIM 800-160 MG PO TABS: 1.0000 | ORAL_TABLET | Freq: Two times a day (BID) | ORAL | 0 refills | Status: DC

## 2016-07-27 MED ORDER — PENCICLOVIR 1 % EX CREA: 1.0000 "application " | TOPICAL_CREAM | CUTANEOUS | 0 refills | Status: DC

## 2016-07-27 NOTE — Progress Notes (Signed)
Date:  07/27/2016   Name:  Barbara GongSonya Branton   DOB:  01-31-1981   MRN:  [id number]   Chief Complaint: Ear Problem (Lt ear ache) and Cyst (Pt knot top of the head.) Otalgia   There is pain in the left ear. This is a new problem. The current episode started in the past 7 days. The problem has been gradually worsening. There has been no fever. Associated symptoms include rhinorrhea and a sore throat. Pertinent negatives include no abdominal pain, headaches, hearing loss or vomiting. Associated symptoms comments: Sinus pressure and yellow nasal discharge. She has tried nothing for the symptoms.  Dysuria   This is a new problem. The current episode started 1 to 4 weeks ago. The problem occurs every urination. The quality of the pain is described as burning. Pertinent negatives include no chills, discharge, flank pain, hematuria, nausea, urgency or vomiting. She has tried nothing for the symptoms.   Cyst - on left side of scalp has been present for 10 years.  It is no painful or draining but may be slightly larger.  She was seen for it in the past and no treatment was recommended.  Review of Systems  Constitutional: Negative for chills, fatigue and fever.  HENT: Positive for ear pain, rhinorrhea and sore throat. Negative for hearing loss, tinnitus and trouble swallowing.   Respiratory: Negative for chest tightness and shortness of breath.   Cardiovascular: Negative for chest pain.  Gastrointestinal: Negative for abdominal pain, nausea and vomiting.  Endocrine: Negative for polydipsia and polyuria.  Genitourinary: Positive for dysuria. Negative for flank pain, hematuria and urgency.  Musculoskeletal: Negative for arthralgias.  Skin:       Cyst on scalp  Neurological: Negative for dizziness and headaches.  Hematological: Negative for adenopathy.  Psychiatric/Behavioral: Negative for sleep disturbance.    Patient Active Problem List   Diagnosis Date Noted  . Antithrombin III deficiency (HCC)  07/27/2016  . 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 Blood pressure elevated without history of HTN 01/13/2016  . Environmental and seasonal allergies 12/17/2015  . H/O pre-term labor 12/17/2015    Prior to Admission medications   Medication Sig Start Date End Date Taking? Authorizing Provider  CAMILA 0.35 MG tablet  07/19/16  Yes Historical Provider, MD  fluticasone (FLONASE) 50 MCG/ACT nasal spray Place 1 spray into both nostrils 2 (two) times daily. 05/22/16  Yes Barbaraann Barthelina A Betancourt, NP  ibuprofen (ADVIL,MOTRIN) 800 MG tablet Take 1 tablet (800 mg total) by mouth 3 (three) times daily. 05/22/16  Yes Barbaraann Barthelina A Betancourt, NP  penciclovir (DENAVIR) 1 % cream Apply 1 application topically every 2 (two) hours. 05/22/16  Yes Barbaraann Barthelina A Betancourt, NP  loratadine (CLARITIN) 10 MG tablet Take 1 tablet (10 mg total) by mouth daily. 05/22/16 06/21/16  Barbaraann Barthelina A Betancourt, NP  sodium chloride (OCEAN) 0.65 % SOLN nasal spray Place 2 sprays into both nostrils every 2 (two) hours while awake. 05/22/16 06/21/16  Barbaraann Barthelina A Betancourt, NP    No Known Allergies  Past Surgical History:  Procedure Laterality Date  . ARTHROSCOPIC REPAIR ACL Right    high school   . CESAREAN SECTION  09/2015   1 vaginal and 1 c section    Social History  Substance Use Topics  . Smoking status: Never Smoker  . Smokeless tobacco: Never Used  . Alcohol use No     Medication list has been reviewed and updated.   Physical Exam  Constitutional: She is oriented  to person, place, and time. She appears well-developed and well-nourished.  HENT:  Right Ear: External ear and ear canal normal. Tympanic membrane is retracted. Tympanic membrane is not erythematous.  Left Ear: External ear and ear canal normal. Tympanic membrane is erythematous and bulging. Tympanic membrane is not retracted.  Nose: Right sinus exhibits maxillary sinus tenderness. Right sinus exhibits no frontal sinus tenderness. Left  sinus exhibits maxillary sinus tenderness. Left sinus exhibits no frontal sinus tenderness.  Mouth/Throat: Uvula is midline and mucous membranes are normal. No oral lesions. No oropharyngeal exudate or posterior oropharyngeal erythema.  Cardiovascular: Normal rate, regular rhythm and normal heart sounds.   Pulmonary/Chest: Breath sounds normal. She has no wheezes. She has no rales.  Abdominal: There is no tenderness.  Lymphadenopathy:    She has no cervical adenopathy.  Neurological: She is alert and oriented to person, place, and time.  Skin:  4mm soft, mobile, non tender subcut mass of left parietal scalp   Urine dipstick shows negative for all components.  Micro exam: not done.  BP 136/78   Pulse 88   Temp 98.6 F (37 C)   Ht 5\' 6"  (1.676 m)   Wt 186 lb (84.4 kg)   SpO2 98%   BMI 30.02 kg/m   Assessment and Plan: 1. Acute non-recurrent maxillary sinusitis Begin Flonase NS - sulfamethoxazole-trimethoprim (BACTRIM DS,SEPTRA DS) 800-160 MG tablet; Take 1 tablet by mouth 2 (two) times daily.  Dispense: 20 tablet; Refill: 0  2. Dysuria No evidence of infection - any should be covered by Bactrim - POCT urinalysis dipstick  3. Sebaceous cyst Patient reassured - no treatment needed at this time   Bari Edward, MD [facility]  07/27/2016

## 2016-07-27 NOTE — Telephone Encounter (Signed)
Pt need refill Rx. Denavir

## 2016-08-22 ENCOUNTER — Ambulatory Visit
Admission: EM | Admit: 2016-08-22 | Discharge: 2016-08-22 | Disposition: A | Discharge status: Home / Self Care | Payer: BLUE CROSS/BLUE SHIELD | Attending: Family Medicine | Admitting: Family Medicine

## 2016-08-22 DIAGNOSIS — R69 Illness, unspecified: Secondary | ICD-10-CM

## 2016-08-22 DIAGNOSIS — J111 Influenza due to unidentified influenza virus with other respiratory manifestations: Secondary | ICD-10-CM

## 2016-08-22 LAB — RAPID STREP SCREEN (MED CTR MEBANE ONLY): Streptococcus, Group A Screen (Direct): NEGATIVE

## 2016-08-22 MED ORDER — ONDANSETRON 8 MG PO TBDP
8.0000 mg | ORAL_TABLET | Freq: Once | ORAL | Status: AC
  Administered 2016-08-22: 8 mg via ORAL

## 2016-08-22 MED ORDER — OSELTAMIVIR PHOSPHATE 75 MG PO CAPS: 75.0000 mg | ORAL_CAPSULE | Freq: Two times a day (BID) | ORAL | 0 refills | Status: DC

## 2016-08-22 MED ORDER — IBUPROFEN 800 MG PO TABS: 800.0000 mg | ORAL_TABLET | Freq: Three times a day (TID) | ORAL | 0 refills | Status: DC | PRN

## 2016-08-22 NOTE — Discharge Instructions (Signed)
Take medication as prescribed. Rest. Drink plenty of fluids.  ° °Follow up with your primary care physician this week as needed. Return to Urgent care for new or worsening concerns.  ° °

## 2016-08-22 NOTE — ED Provider Notes (Signed)
[facility] ____________________________________________  Time seen: Approximately 0900 AM  I have reviewed the triage vital signs and the nursing notes.   HISTORY  Chief Complaint Otalgia; Headache; and Emesis   HPI Barbara Schmidt is a 36 y.o. female present for the complaints of acute onset Friday night of sore throat, runny nose, nasal congestion, body aches, chills and ear discomfort. Patient reports feeling she had a fever, denies known fevers. Patient reports sore throat as mild. Patient states her daughter was tested positive for influenza this past Friday at her pediatrician's office. Patient reports continues to drink fluids well, decreased appetite. Patient reports she has had a few episodes of vomiting yesterday, denies diarrhea. Denies abdominal pain. Patient reports has not been taking any medications at home for the same complaints.  Denies chest pain, shortness of breath, abdominal pain, dysuria, extremity pain, extremity swelling or rash. Denies recent sickness. Denies recent antibiotic use.   Patient's last menstrual period was 08/15/2016. Denies pregnancy. Reports not breast-feeding. Bari Edward, MD PCP  Past Medical History:  Diagnosis Date  . Allergy   . High-risk pregnancy   . Miscarriage    2  . Vaginal delivery     Patient Active Problem List   Diagnosis Date Noted  . 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 Sebaceous cyst 07/27/2016  . Blood pressure elevated without history of HTN 01/13/2016  . Environmental and seasonal allergies 12/17/2015  . H/O pre-term labor 12/17/2015    Past Surgical History:  Procedure Laterality Date  . ARTHROSCOPIC REPAIR ACL Right    high school   . CESAREAN SECTION  09/2015   1 vaginal and 1 c section     No current facility-administered medications for this encounter.   Current Outpatient Prescriptions:  .  CAMILA  0.35 MG tablet, , Disp: , Rfl:  .  fluticasone (FLONASE) 50 MCG/ACT nasal spray, Place 1 spray into both nostrils 2 (two) times daily., Disp: 16 g, Rfl: 0 .  ibuprofen (ADVIL,MOTRIN) 800 MG tablet, Take 1 tablet (800 mg total) by mouth every 8 (eight) hours as needed for mild pain or moderate pain., Disp: 15 tablet, Rfl: 0 .  loratadine (CLARITIN) 10 MG tablet, Take 1 tablet (10 mg total) by mouth daily., Disp: 30 tablet, Rfl: 0 .  oseltamivir (TAMIFLU) 75 MG capsule, Take 1 capsule (75 mg total) by mouth every 12 (twelve) hours., Disp: 10 capsule, Rfl: 0 .  penciclovir (DENAVIR) 1 % cream, Apply 1 application topically every 2 (two) hours., Disp: 1.5 g, Rfl: 0 .  sodium chloride (OCEAN) 0.65 % SOLN nasal spray, Place 2 sprays into both nostrils every 2 (two) hours while awake., Disp: , Rfl: 0 .  sulfamethoxazole-trimethoprim (BACTRIM DS,SEPTRA DS) 800-160 MG tablet, Take 1 tablet by mouth 2 (two) times daily., Disp: 20 tablet, Rfl: 0  Allergies Patient has no known allergies.  Family History  Problem Relation Age of Onset  . Diabetes Maternal Grandmother   . Diabetes Paternal Uncle   . Kidney disease Paternal Uncle     Social History Social History  Substance Use Topics  . Smoking status: Never Smoker  . Smokeless tobacco: Never Used  . Alcohol use No    Review of Systems Constitutional: As above Eyes: No visual changes. ENT: As above. Cardiovascular: Denies chest pain. Respiratory: Denies shortness of breath. Gastrointestinal: No abdominal pain.  No diarrhea.  No constipation. Genitourinary: Negative for dysuria. Musculoskeletal: Negative for back  pain. Skin: Negative for rash. Neurological: Negative for focal weakness or numbness.  10-point ROS otherwise negative.  ____________________________________________   PHYSICAL EXAM:  VITAL SIGNS: ED Triage Vitals  Enc Vitals Group     BP 08/22/16 0833 129/90     Pulse Rate 08/22/16 0833 79     Resp 08/22/16 0833 18      Temp 08/22/16 0833 98.2 F (36.8 C)     Temp Source 08/22/16 0833 Oral     SpO2 08/22/16 0833 99 %     Weight 08/22/16 0835 180 lb (81.6 kg)     Height 08/22/16 0835 5\' 6"  (1.676 m)     Head Circumference --      Peak Flow --      Pain Score 08/22/16 0838 7     Pain Loc --      Pain Edu? --      Excl. in GC? --     Constitutional: Alert and oriented. Well appearing and in no acute distress. Eyes: Conjunctivae are normal. PERRL. EOMI. Head: Atraumatic. No sinus tenderness to palpation. No swelling. No erythema.  Ears: no erythema, normal TMs bilaterally.   Nose:Nasal congestion with clear rhinorrhea  Mouth/Throat: Mucous membranes are moist. Mild pharyngeal erythema. No tonsillar swelling or exudate.  Neck: No stridor.  No cervical spine tenderness to palpation. Hematological/Lymphatic/Immunilogical: No cervical lymphadenopathy. Cardiovascular: Normal rate, regular rhythm. Grossly normal heart sounds.  Good peripheral circulation. Respiratory: Normal respiratory effort.  No retractions. No wheezes, rales or rhonchi. Good air movement.  Gastrointestinal: Soft and nontender.  No CVA tenderness. Musculoskeletal: Ambulatory with steady gait. No cervical, thoracic or lumbar tenderness to palpation. Neurologic:  Normal speech and language. No gait instability. Skin:  Skin appears warm, dry and intact. No rash noted. Psychiatric: Mood and affect are normal. Speech and behavior are normal. ___________________________________________   LABS (all labs ordered are listed, but only abnormal results are displayed)  Labs Reviewed  RAPID STREP SCREEN (NOT AT [facility])  CULTURE, GROUP A STREP [facility])    PROCEDURES Procedures    INITIAL IMPRESSION / ASSESSMENT AND PLAN / ED COURSE  Pertinent labs & imaging results that were available during my care of the patient were reviewed by me and considered in my medical decision making (see chart for details).  Well-appearing patient. No acute  distress. Quick strep negative, will culture. Direct influenza contacts at home just prior to her symptom onset. Suspect influenza.One dose of 8 mg ODT Zofran given once in urgent care. Discussed evaluation and treatment options with patient. Discussed use of Tamiflu. Rx for Tamiflu sent, patient states she does not necessarily yet if she will take it but will decide today. Patient requests prescription for 800 mg of ibuprofen, Rx given. Patient declines need for cough medications or other medicines. Denies need for nausea medication. Encouraged supportive care, rest, fluids and close follow-up as needed.Discussed indication, risks and benefits of medications with patient.  Discussed follow up with Primary care physician this week. Discussed follow up and return parameters including no resolution or any worsening concerns. Patient verbalized understanding and agreed to plan.   ____________________________________________   FINAL CLINICAL IMPRESSION(S) / ED DIAGNOSES  Final diagnoses:  Influenza-like illness     Discharge Medication List as of 08/22/2016  9:13 AM    START taking these medications   Details  oseltamivir (TAMIFLU) 75 MG capsule Take 1 capsule (75 mg total) by mouth every 12 (twelve) hours., Starting Sun 08/22/2016, Normal  Note: This dictation was prepared with Dragon dictation along with smaller phrase technology. Any transcriptional errors that result from this process are unintentional.         Renford Dills, NP 08/22/16 1129    Renford Dills, NP 08/22/16 1129    Renford Dills, NP 08/22/16 1131

## 2016-08-22 NOTE — ED Triage Notes (Addendum)
Pt reports her daughter tested flu positive on Friday. Pt started Friday night with headache, sore throat, ear pain bilaterally, and vomiting. Pain 7/10. Unknown if fever at home. "I think I have an ear infection."

## 2016-08-23 ENCOUNTER — Ambulatory Visit (INDEPENDENT_AMBULATORY_CARE_PROVIDER_SITE_OTHER): Payer: BLUE CROSS/BLUE SHIELD | Admitting: Internal Medicine

## 2016-08-23 ENCOUNTER — Encounter: Payer: Self-pay | Admitting: Internal Medicine

## 2016-08-23 VITALS — BP 124/98 | HR 83 | Temp 98.3°F | Ht 66.0 in | Wt 187.0 lb

## 2016-08-23 DIAGNOSIS — J0140 Acute pansinusitis, unspecified: Secondary | ICD-10-CM | POA: Diagnosis not present

## 2016-08-23 DIAGNOSIS — B009 Herpesviral infection, unspecified: Secondary | ICD-10-CM

## 2016-08-23 MED ORDER — GUAIFENESIN-CODEINE 100-10 MG/5ML PO SYRP: 5.0000 mL | ORAL_SOLUTION | Freq: Three times a day (TID) | ORAL | 0 refills | Status: DC | PRN

## 2016-08-23 MED ORDER — AMOXICILLIN-POT CLAVULANATE 875-125 MG PO TABS: 1.0000 | ORAL_TABLET | Freq: Two times a day (BID) | ORAL | 0 refills | Status: DC

## 2016-08-23 MED ORDER — PENCICLOVIR 1 % EX CREA: 1.0000 "application " | TOPICAL_CREAM | CUTANEOUS | 0 refills | Status: DC

## 2016-08-23 NOTE — Progress Notes (Signed)
Date:  08/23/2016   Name:  Barbara Schmidt   DOB:  Dec 24, 1980   MRN:  [id number]   Chief Complaint: Sinus Problem (pt stated sinus pressure,ear problem ) Sinus Problem  This is a new problem. The current episode started in the past 7 days. The problem has been gradually worsening since onset. There has been no fever. She is experiencing no pain. Associated symptoms include congestion, coughing, headaches, sinus pressure and a sore throat. Pertinent negatives include no chills. Past treatments include saline sprays.  Seen in UC yesterday - strep negative.  Her 36 y/o diagnosed with flu last week.  She was given Tamiflu for prevention but did not get it due to cost. 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  Has used Denavir in the past with good results.  Would like an Rx.  Review of Systems  Constitutional: Negative for chills, fatigue and fever.  HENT: Positive for congestion, sinus pressure and sore throat.   Eyes: Negative for visual disturbance.  Respiratory: Positive for cough.   Gastrointestinal: Negative for abdominal pain.  Neurological: Positive for headaches.    Patient Active Problem List   Diagnosis Date Noted  . 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 Sebaceous cyst 07/27/2016  . Blood pressure elevated without history of HTN 01/13/2016  . Environmental and seasonal allergies 12/17/2015  . H/O pre-term labor 12/17/2015    Prior to Admission medications   Medication Sig Start Date End Date Taking? Authorizing Provider  CAMILA 0.35 MG tablet  07/19/16  Yes Historical Provider, MD  fluticasone (FLONASE) 50 MCG/ACT nasal spray Place 1 spray into both nostrils 2 (two) times daily. 05/22/16  Yes Barbaraann Barthel, NP  ibuprofen (ADVIL,MOTRIN) 800 MG tablet Take 1 tablet (800 mg total) by mouth every 8 (eight) hours as needed for mild pain or moderate pain. 08/22/16  Yes Renford Dills, NP    penciclovir (DENAVIR) 1 % cream Apply 1 application topically every 2 (two) hours. 07/27/16  Yes Reubin Milan, MD  loratadine (CLARITIN) 10 MG tablet Take 1 tablet (10 mg total) by mouth daily. 05/22/16 06/21/16  Barbaraann Barthel, NP  sodium chloride (OCEAN) 0.65 % SOLN nasal spray Place 2 sprays into both nostrils every 2 (two) hours while awake. 05/22/16 06/21/16  Barbaraann Barthel, NP    No Known Allergies  Past Surgical History:  Procedure Laterality Date  . ARTHROSCOPIC REPAIR ACL Right    high school   . CESAREAN SECTION  09/2015   1 vaginal and 1 c section    Social History  Substance Use Topics  . Smoking status: Never Smoker  . Smokeless tobacco: Never Used  . Alcohol use No     Medication list has been reviewed and updated.   Physical Exam  Constitutional: She is oriented to person, place, and time. She appears well-developed and well-nourished. No distress.  HENT:  Head: Normocephalic and atraumatic.  Right Ear: External ear and ear canal normal. Tympanic membrane is erythematous and retracted.  Left Ear: External ear and ear canal normal. Tympanic membrane is erythematous and retracted.  Nose: Right sinus exhibits maxillary sinus tenderness and frontal sinus tenderness. Left sinus exhibits maxillary sinus tenderness and frontal sinus tenderness.  Mouth/Throat: Uvula is midline and mucous membranes are normal. No oral lesions. Posterior oropharyngeal erythema present. No oropharyngeal exudate.  Neck: Normal range of motion. Neck supple. No thyromegaly present.  Cardiovascular: Normal rate, regular  rhythm and normal heart sounds.   Pulmonary/Chest: Effort normal and breath sounds normal. No respiratory distress. She has no wheezes. She has no rales.  Musculoskeletal: Normal range of motion.  Lymphadenopathy:    She has no cervical adenopathy.  Neurological: She is alert and oriented to person, place, and time.  Skin: Skin is warm and dry. No rash noted.   Psychiatric: She has a normal mood and affect. Her behavior is normal. Thought content normal.  Nursing note and vitals reviewed.   BP (!) 124/98   Pulse 83   Temp 98.3 F (36.8 C)   Ht 5\' 6"  (1.676 m)   Wt 187 lb (84.8 kg)   LMP 08/15/2016   SpO2 99%   BMI 30.18 kg/m   Assessment and Plan: 1. Acute non-recurrent pansinusitis Resume Flonase NS - amoxicillin-clavulanate (AUGMENTIN) 875-125 MG tablet; Take 1 tablet by mouth 2 (two) times daily.  Dispense: 20 tablet; Refill: 0 - guaiFENesin-codeine (ROBITUSSIN AC) 100-10 MG/5ML syrup; Take 5 mLs by mouth 3 (three) times daily as needed for cough.  Dispense: 150 mL; Refill: 0  2. HSV-1 infection Refilled Denavir ointment   Bari EdwardLaura Berglund, MD [facility]  08/23/2016

## 2016-08-25 LAB — CULTURE, GROUP A STREP (THRC)

## 2016-08-30 ENCOUNTER — Encounter: Payer: Self-pay | Admitting: *Deleted

## 2016-08-30 ENCOUNTER — Ambulatory Visit
Admission: EM | Admit: 2016-08-30 | Discharge: 2016-08-30 | Disposition: A | Discharge status: Home / Self Care | Payer: BLUE CROSS/BLUE SHIELD | Attending: Family Medicine | Admitting: Family Medicine

## 2016-08-30 DIAGNOSIS — K644 Residual hemorrhoidal skin tags: Secondary | ICD-10-CM | POA: Diagnosis not present

## 2016-08-30 MED ORDER — DOCUSATE SODIUM 100 MG PO CAPS: 100.0000 mg | ORAL_CAPSULE | Freq: Two times a day (BID) | ORAL | 0 refills | Status: AC

## 2016-08-30 MED ORDER — IBUPROFEN 800 MG PO TABS: 800.0000 mg | ORAL_TABLET | Freq: Three times a day (TID) | ORAL | 0 refills | Status: DC | PRN

## 2016-08-30 MED ORDER — HYDROCORTISONE ACE-PRAMOXINE 1-1 % RE FOAM: 1.0000 | Freq: Three times a day (TID) | RECTAL | 0 refills | Status: AC

## 2016-08-30 NOTE — ED Provider Notes (Signed)
[facility] ____________________________________________  Time seen: Approximately 4:07 PM  I have reviewed the triage vital signs and the nursing notes.   HISTORY  Chief Complaint Hemorrhoids  HPI Barbara Schmidt is a 36 y.o. female  presenting for evaluation of concern of hemorrhoid. Patient reports the last 2 days she has had rectal discomfort describing as tenderness and soreness and she can note and external abnormality when looking. Patient reports she recently had the flu, and states that she now feels better and that she is healed from the flu. Patient states that she does not feel like this is from coughing. Patient does state that she occasionally has some constipation and states that she feels this may be secondary to constipation. Patient reports this past Saturday with her last bowel movement, describing as she did strain some to have her bowel movement but denies hard to pass stool. Denies any abnormal colored stools, melena, hematochezia. Denies blood in toilet or when wiping. Patient reports she has had similar in the past with hemorrhoids area denies any surgical or intervention to resolve hemorrhoids. Patient states that she has not used any over-the-counter medications or taking any over-the-counter medications for the same complaint. Patient reports otherwise she feels well. Patient does also report she does often have heavy lifting at home, stating that she lifts her young children. Denies any acute onset or known cause from heavy lifting.   Denies chest pain, shortness of breath, abdominal pain, dysuria, extremity pain, extremity swelling or rash. Denies recent sickness. Denies recent antibiotic use.   Bari Edward, MD: PCP   Past Medical History:  Diagnosis Date  . Allergy   . High-risk pregnancy   . Miscarriage    2  . Vaginal delivery     Patient Active Problem List   Diagnosis Date Noted  . 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 Antithrombin III  deficiency (HCC) 07/27/2016  . 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 Sebaceous cyst 07/27/2016  . Blood pressure elevated without history of HTN 01/13/2016  . Environmental and seasonal allergies 12/17/2015  . H/O pre-term labor 12/17/2015    Past Surgical History:  Procedure Laterality Date  . ARTHROSCOPIC REPAIR ACL Right    high school   . CESAREAN SECTION  09/2015   1 vaginal and 1 c section     No current facility-administered medications for this encounter.   Current Outpatient Prescriptions:  .  amoxicillin-clavulanate (AUGMENTIN) 875-125 MG tablet, Take 1 tablet by mouth 2 (two) times daily., Disp: 20 tablet, Rfl: 0 .  CAMILA 0.35 MG tablet, , Disp: , Rfl:  .  docusate sodium (COLACE) 100 MG capsule, Take 1 capsule (100 mg total) by mouth 2 (two) times daily., Disp: 20 capsule, Rfl: 0 .  fluticasone (FLONASE) 50 MCG/ACT nasal spray, Place 1 spray into both nostrils 2 (two) times daily., Disp: 16 g, Rfl: 0 .  guaiFENesin-codeine (ROBITUSSIN AC) 100-10 MG/5ML syrup, Take 5 mLs by mouth 3 (three) times daily as needed for cough., Disp: 150 mL, Rfl: 0 .  hydrocortisone-pramoxine (PROCTOFOAM HC) rectal foam, Place 1 applicator rectally 3 (three) times daily., Disp: 10 g, Rfl: 0 .  ibuprofen (ADVIL,MOTRIN) 800 MG tablet, Take 1 tablet (800 mg total) by mouth every 8 (eight) hours as needed for mild pain or moderate pain., Disp: 15 tablet, Rfl: 0 .  loratadine (CLARITIN) 10 MG tablet, Take 1 tablet (10 mg total) by mouth daily., Disp: 30 tablet, Rfl: 0 .  penciclovir (DENAVIR) 1 %  cream, Apply 1 application topically every 2 (two) hours., Disp: 1.5 g, Rfl: 0 .  sodium chloride (OCEAN) 0.65 % SOLN nasal spray, Place 2 sprays into both nostrils every 2 (two) hours while awake., Disp: , Rfl: 0  Allergies Patient has no known allergies.  Family History  Problem Relation Age of Onset  . Diabetes Maternal Grandmother   . Diabetes Paternal Uncle   .  Kidney disease Paternal Uncle     Social History Social History  Substance Use Topics  . Smoking status: Never Smoker  . Smokeless tobacco: Never Used  . Alcohol use No    Review of Systems Constitutional: No fever/chills Eyes: No visual changes. ENT: No sore throat. Cardiovascular: Denies chest pain. Respiratory: Denies shortness of breath. Gastrointestinal: No abdominal pain.  No nausea, no vomiting.  No diarrhea.  Genitourinary: Negative for dysuria. Musculoskeletal: Negative for back pain. Skin: Negative for rash. Neurological: Negative for headaches, focal weakness or numbness.  10-point ROS otherwise negative.  ____________________________________________   PHYSICAL EXAM:  VITAL SIGNS: ED Triage Vitals  Enc Vitals Group     BP 08/30/16 1538 (!) 130/93     Pulse Rate 08/30/16 1538 75     Resp 08/30/16 1538 16     Temp 08/30/16 1538 98.5 F (36.9 C)     Temp Source 08/30/16 1538 Oral     SpO2 08/30/16 1538 100 %     Weight --      Height --      Head Circumference --      Peak Flow --      Pain Score 08/30/16 1540 4     Pain Loc --      Pain Edu? --      Excl. in GC? --     Constitutional: Alert and oriented. Well appearing and in no acute distress. Eyes: Conjunctivae are normal. PERRL. EOMI. ENT      Head: Normocephalic and atraumatic.      Mouth/Throat: Mucous membranes are moist. Cardiovascular: Normal rate, regular rhythm. Grossly normal heart sounds.  Good peripheral circulation. Respiratory: Normal respiratory effort without tachypnea nor retractions. Breath sounds are clear and equal bilaterally. No wheezes, rales, rhonchi. Gastrointestinal: Soft and nontender. No distention. Normal Bowel sounds. No CVA tenderness. Rectal: Exam completed with Jeannett SeniorStephen RN at bedside as chaperone. 1 external nonthrombosed hemorrhoid noted and mild to moderately tender at hemorrhoid, patient declined internal rectal exam. No surrounding erythema or tenderness, no  rash Musculoskeletal:  Nontender with normal range of motion in all extremities. No midline cervical, thoracic or lumbar tenderness to palpation.  Neurologic:  Normal speech and language.  Speech is normal. No gait instability.  Skin:  Skin is warm, dry and intact. No rash noted. Psychiatric: Mood and affect are normal. Speech and behavior are normal. Patient exhibits appropriate insight and judgment   ___________________________________________   LABS (all labs ordered are listed, but only abnormal results are displayed)  Labs Reviewed - No data to display ____________________________________________  PROCEDURES Procedures  INITIAL IMPRESSION / ASSESSMENT AND PLAN / ED COURSE  Pertinent labs & imaging results that were available during my care of the patient were reviewed by me and considered in my medical decision making (see chart for details).  Well-appearing patient. No acute distress. Patient with two-day history of external hemorrhoid noted. Patient declines any abnormal colored stools, blood in stool and declines internal rectal exam. Visualization revealing nonthrombosed hemorrhoid. Discussed with patient we'll treat patient conservatively with topical Proctofoam HC, oral Colace  and patient requests 800 ibuprofen to have as needed for pain. Encouraged no heavy lifting, sitz baths and monitoring. Encouraged water and fluid intake. Follow-up with primary care physician. Patient states that she will follow up with her OB/GYN as needed for any continued complaints.Discussed indication, risks and benefits of medications with patient.  Discussed follow up with Primary care physician this week. Discussed follow up and return parameters including no resolution or any worsening concerns. Patient verbalized understanding and agreed to plan.   ____________________________________________   FINAL CLINICAL IMPRESSION(S) / ED DIAGNOSES  Final diagnoses:  External hemorrhoid      Discharge Medication List as of 08/30/2016  4:36 PM    START taking these medications   Details  docusate sodium (COLACE) 100 MG capsule Take 1 capsule (100 mg total) by mouth 2 (two) times daily., Starting Mon 08/30/2016, Until Thu 09/09/2016, Normal    hydrocortisone-pramoxine (PROCTOFOAM HC) rectal foam Place 1 applicator rectally 3 (three) times daily., Starting Mon 08/30/2016, Until Mon 09/06/2016, Normal        Note: This dictation was prepared with Dragon dictation along with smaller phrase technology. Any transcriptional errors that result from this process are unintentional.         Renford Dills, NP 08/30/16 1807

## 2016-08-30 NOTE — Discharge Instructions (Signed)
Take medication as prescribed. Rest. No heavy lifting. Drink plenty of fluids. Use sitz baths.   Follow up with your primary care physician or OBGYN as discussed as needed. Return to Urgent care for new or worsening concerns.

## 2016-08-30 NOTE — ED Triage Notes (Signed)
Patient started feeling discomfort in her rectum and anus yesterday. Patient reports possible swollen hemorrhoid tissue.

## 2016-09-21 ENCOUNTER — Ambulatory Visit: Payer: BLUE CROSS/BLUE SHIELD | Admitting: Internal Medicine

## 2016-09-22 ENCOUNTER — Ambulatory Visit (INDEPENDENT_AMBULATORY_CARE_PROVIDER_SITE_OTHER): Payer: BLUE CROSS/BLUE SHIELD | Admitting: Internal Medicine

## 2016-09-22 ENCOUNTER — Encounter: Payer: Self-pay | Admitting: Internal Medicine

## 2016-09-22 VITALS — BP 142/82 | HR 76 | Temp 97.8°F | Ht 66.0 in | Wt 183.0 lb

## 2016-09-22 DIAGNOSIS — Z111 Encounter for screening for respiratory tuberculosis: Secondary | ICD-10-CM | POA: Diagnosis not present

## 2016-09-22 DIAGNOSIS — J0100 Acute maxillary sinusitis, unspecified: Secondary | ICD-10-CM

## 2016-09-22 MED ORDER — AZITHROMYCIN 250 MG PO TABS: ORAL_TABLET | ORAL | 0 refills | Status: DC

## 2016-09-22 NOTE — Patient Instructions (Signed)
Flonase 2 sprays in each nostril daily  Sudafed 30mg  twice a day

## 2016-09-22 NOTE — Progress Notes (Signed)
Date:  09/22/2016   Name:  Barbara GongSonya Meckley   DOB:  November 15, 1980   MRN:  [id number]   Chief Complaint: Sinusitis (Face/ ear pressure. Throat sore. X 4 days.) Sinusitis  This is a new problem. The current episode started in the past 7 days. The problem is unchanged. There has been no fever. The pain is mild. Associated symptoms include congestion, sinus pressure and a sore throat. Pertinent negatives include no chills, coughing, ear pain or shortness of breath. (No sinus drainage) Past treatments include nothing.   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  She had a PPD last year but it was out of state and she does not have the written result.   Review of Systems  Constitutional: Negative for chills, fatigue and unexpected weight change.  HENT: Positive for congestion, sinus pressure and sore throat. Negative for ear pain.   Eyes: Negative for visual disturbance.  Respiratory: Negative for cough, shortness of breath and wheezing.   Cardiovascular: Negative for chest pain, palpitations and leg swelling.    Patient Active Problem List   Diagnosis Date Noted  . 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 Sebaceous cyst 07/27/2016  . Blood pressure elevated without history of HTN 01/13/2016  . Environmental and seasonal allergies 12/17/2015  . H/O pre-term labor 12/17/2015    Prior to Admission medications   Medication Sig Start Date End Date Taking? Authorizing Provider  CAMILA 0.35 MG tablet  07/19/16  Yes Historical Provider, MD  fluticasone (FLONASE) 50 MCG/ACT nasal spray Place 1 spray into both nostrils 2 (two) times daily. 05/22/16  Yes Barbaraann Barthelina A Betancourt, NP  ibuprofen (ADVIL,MOTRIN) 800 MG tablet Take 1 tablet (800 mg total) by mouth every 8 (eight) hours as needed for mild pain or moderate pain. 08/30/16  Yes Renford DillsLindsey Miller, NP    loratadine (CLARITIN) 10 MG tablet Take 1 tablet (10 mg total) by mouth daily. 05/22/16 06/21/16  Barbaraann Barthelina A Betancourt, NP  sodium chloride (OCEAN) 0.65 % SOLN nasal spray Place 2 sprays into both nostrils every 2 (two) hours while awake. 05/22/16 06/21/16  Barbaraann Barthelina A Betancourt, NP    No Known Allergies  Past Surgical History:  Procedure Laterality Date  . ARTHROSCOPIC REPAIR ACL Right    high school   . CESAREAN SECTION  09/2015   1 vaginal and 1 c section    Social History  Substance Use Topics  . Smoking status: Never Smoker  . Smokeless tobacco: Never Used  . Alcohol use No     Medication list has been reviewed and updated.   Physical Exam  Constitutional: She is oriented to person, place, and time. She appears well-developed and well-nourished. No distress.  HENT:  Head: Normocephalic and atraumatic.  Right Ear: External ear and ear canal normal. Tympanic membrane is not erythematous and not retracted.  Left Ear: External ear and ear canal normal. Tympanic membrane is not erythematous and not retracted.  Nose: Right sinus exhibits maxillary sinus tenderness and frontal sinus tenderness. Left sinus exhibits maxillary sinus tenderness and frontal sinus tenderness.  Mouth/Throat: Uvula is midline and mucous membranes are normal. No oral lesions. No oropharyngeal exudate or posterior oropharyngeal erythema.  Cardiovascular: Normal rate, regular rhythm and normal heart sounds.   Pulmonary/Chest: Effort normal and breath sounds normal. No respiratory distress. She has no wheezes. She has no rales.  Musculoskeletal: Normal range  of motion. She exhibits no edema or tenderness.  Lymphadenopathy:    She has no cervical adenopathy.  Neurological: She is alert and oriented to person, place, and time.  Skin: Skin is warm and dry. No rash noted.  Psychiatric: She has a normal mood and affect. Her behavior is normal. Thought content normal.  Nursing note and vitals reviewed.   BP (!)  142/82   Pulse 76   Temp 97.8 F (36.6 C)   Ht 5\' 6"  (1.676 m)   Wt 183 lb (83 kg)   LMP 09/22/2016 (Exact Date)   SpO2 100%   BMI 29.54 kg/m   Assessment and Plan: 1. Acute non-recurrent maxillary sinusitis Flonase and sudafed Zpak only if s/s infection occur  2. Screening for tuberculosis Return in 48 hours for reading and form completion - PPD   Meds ordered this encounter  Medications  . azithromycin (ZITHROMAX Z-PAK) 250 MG tablet    Sig: UAD    Dispense:  6 each    Refill:  0    Bari Edward, MD [facility]  09/22/2016

## 2016-09-24 LAB — TB SKIN TEST
Induration: 0 mm
TB Skin Test: NEGATIVE

## 2016-10-06 DIAGNOSIS — K649 Unspecified hemorrhoids: Secondary | ICD-10-CM | POA: Diagnosis not present

## 2016-12-06 ENCOUNTER — Ambulatory Visit (INDEPENDENT_AMBULATORY_CARE_PROVIDER_SITE_OTHER): Payer: BLUE CROSS/BLUE SHIELD | Admitting: Internal Medicine

## 2016-12-06 ENCOUNTER — Encounter: Payer: Self-pay | Admitting: Internal Medicine

## 2016-12-06 VITALS — BP 128/84 | HR 76 | Temp 98.5°F | Ht 66.0 in | Wt 185.0 lb

## 2016-12-06 DIAGNOSIS — J019 Acute sinusitis, unspecified: Secondary | ICD-10-CM

## 2016-12-06 MED ORDER — AMOXICILLIN-POT CLAVULANATE 875-125 MG PO TABS: 1.0000 | ORAL_TABLET | Freq: Two times a day (BID) | ORAL | 0 refills | Status: DC

## 2016-12-06 MED ORDER — ACYCLOVIR 5 % EX OINT: 1.0000 "application " | TOPICAL_OINTMENT | CUTANEOUS | 1 refills | Status: DC

## 2016-12-06 NOTE — Progress Notes (Signed)
Date:  12/06/2016   Name:  Barbara Schmidt   DOB:  12/23/1980   MRN:  [id number]   Chief Complaint: Sinusitis (Since friday- pressure in face, nose, and ears. Green production- with cough. No fever noticed. ) Sinusitis  This is a new problem. The current episode started in the past 7 days. There has been no fever. The pain is mild. Associated symptoms include congestion, coughing, headaches, sinus pressure and a sore throat. Pertinent negatives include no shortness of breath. Past treatments include spray decongestants. The treatment provided mild relief.   HSV-1 - could not afford Denavir.  Will try acyclovir topical.   Review of Systems  Constitutional: Positive for fatigue. Negative for fever and unexpected weight change.  HENT: Positive for congestion, rhinorrhea, sinus pressure and sore throat.   Respiratory: Positive for cough. Negative for chest tightness, shortness of breath and wheezing.   Cardiovascular: Negative for chest pain.  Neurological: Positive for headaches. Negative for dizziness and numbness.    Patient Active Problem List   Diagnosis Date Noted  . 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 Sebaceous cyst 07/27/2016  . Blood pressure elevated without history of HTN 01/13/2016  . Environmental and seasonal allergies 12/17/2015  . H/O pre-term labor 12/17/2015    Prior to Admission medications   Medication Sig Start Date End Date Taking? Authorizing Provider  CAMILA 0.35 MG tablet  07/19/16  Yes [provider]  fluticasone (FLONASE) 50 MCG/ACT nasal spray Place 1 spray into both nostrils 2 (two) times daily. 05/22/16  Yes Betancourt, Jarold Song, NP  ibuprofen (ADVIL,MOTRIN) 800 MG tablet Take 1 tablet (800 mg total) by mouth every 8 (eight) hours as needed for mild pain or moderate pain. 08/30/16  Yes Renford Dills, NP    No Known  Allergies  Past Surgical History:  Procedure Laterality Date  . ARTHROSCOPIC REPAIR ACL Right    high school   . CESAREAN SECTION  09/2015   1 vaginal and 1 c section    Social History  Substance Use Topics  . Smoking status: Never Smoker  . Smokeless tobacco: Never Used  . Alcohol use No     Medication list has been reviewed and updated.   Physical Exam  Constitutional: She is oriented to person, place, and time. She appears well-developed and well-nourished.  HENT:  Right Ear: External ear and ear canal normal. Tympanic membrane is erythematous. Tympanic membrane is not retracted.  Left Ear: External ear and ear canal normal. Tympanic membrane is retracted. Tympanic membrane is not erythematous.  Nose: Right sinus exhibits maxillary sinus tenderness and frontal sinus tenderness. Left sinus exhibits maxillary sinus tenderness and frontal sinus tenderness.  Mouth/Throat: Uvula is midline and mucous membranes are normal. No oral lesions. Posterior oropharyngeal erythema present. No oropharyngeal exudate.  Tonsilith on right  Neck: Normal range of motion. Neck supple.  Cardiovascular: Normal rate, regular rhythm and normal heart sounds.   Pulmonary/Chest: Breath sounds normal. She has no wheezes. She has no rales.  Lymphadenopathy:    She has no cervical adenopathy.  Neurological: She is alert and oriented to person, place, and time.    BP 128/84 (BP Location: Right Arm, Patient Position: Sitting, Cuff Size: Normal)   Pulse 76   Temp 98.5 F (36.9 C)   Ht 5\' 6"  (1.676 m)   Wt 185 lb (83.9 kg)   LMP 12/04/2016 (Exact Date)  SpO2 96%   BMI 29.86 kg/m   Assessment and Plan: 1. Acute sinusitis, recurrence not specified, unspecified location Continue flonase Advil for headache and sore throat   Meds ordered this encounter  Medications  . amoxicillin-clavulanate (AUGMENTIN) 875-125 MG tablet    Sig: Take 1 tablet by mouth 2 (two) times daily.    Dispense:  20 tablet     Refill:  0  . acyclovir ointment (ZOVIRAX) 5 %    Sig: Apply 1 application topically every 3 (three) hours.    Dispense:  15 g    Refill:  1    Bari EdwardLaura Ashleigh Arya, MD [facility]  12/06/2016

## 2017-07-20 DIAGNOSIS — L720 Epidermal cyst: Secondary | ICD-10-CM | POA: Diagnosis not present

## 2017-12-14 DIAGNOSIS — Z01419 Encounter for gynecological examination (general) (routine) without abnormal findings: Secondary | ICD-10-CM | POA: Diagnosis not present

## 2017-12-14 DIAGNOSIS — R300 Dysuria: Secondary | ICD-10-CM | POA: Diagnosis not present

## 2018-01-01 ENCOUNTER — Other Ambulatory Visit: Payer: Self-pay | Admitting: Internal Medicine

## 2018-01-02 ENCOUNTER — Other Ambulatory Visit: Payer: Self-pay | Admitting: Internal Medicine

## 2018-01-02 MED ORDER — VALACYCLOVIR HCL 1 G PO TABS: 2000.0000 mg | ORAL_TABLET | Freq: Two times a day (BID) | ORAL | 5 refills | Status: AC

## 2018-01-16 ENCOUNTER — Encounter: Payer: Self-pay | Admitting: Family Medicine

## 2018-01-16 ENCOUNTER — Ambulatory Visit (INDEPENDENT_AMBULATORY_CARE_PROVIDER_SITE_OTHER): Payer: BLUE CROSS/BLUE SHIELD | Admitting: Family Medicine

## 2018-01-16 VITALS — BP 130/80 | HR 80 | Ht 66.0 in | Wt 169.0 lb

## 2018-01-16 DIAGNOSIS — J0100 Acute maxillary sinusitis, unspecified: Secondary | ICD-10-CM

## 2018-01-16 MED ORDER — AMOXICILLIN-POT CLAVULANATE 875-125 MG PO TABS: 1.0000 | ORAL_TABLET | Freq: Two times a day (BID) | ORAL | 0 refills | Status: DC

## 2018-01-16 NOTE — Progress Notes (Signed)
Name: Barbara Schmidt   MRN: [id number]    DOB: 1980/10/10   Date:01/16/2018       Progress Note  Subjective  Chief Complaint  Chief Complaint  Patient presents with  . Sinusitis    nasal cong, ear pain and pressure, green productive cough    Sinusitis  This is a new problem. The current episode started 1 to 4 weeks ago (10-14 days). The problem has been gradually worsening since onset. There has been no fever. The pain is moderate. Associated symptoms include congestion, ear pain, headaches, a hoarse voice, shortness of breath, sinus pressure and a sore throat. Pertinent negatives include no chills, coughing, diaphoresis, neck pain, sneezing or swollen glands. ("cough and snot"/green) Past treatments include oral decongestants. The treatment provided no relief.    No problem-specific Assessment & Plan notes found for this encounter.   Past Medical History:  Diagnosis Date  . Allergy   . High-risk pregnancy   . Miscarriage    2  . Vaginal delivery     Past Surgical History:  Procedure Laterality Date  . ARTHROSCOPIC REPAIR ACL Right    high school   . CESAREAN SECTION  09/2015   1 vaginal and 1 c section    Family History  Problem Relation Age of Onset  . Diabetes Maternal Grandmother   . Diabetes Paternal Uncle   . Kidney disease Paternal Uncle     Social History   Socioeconomic History  . Marital status: Married    Spouse name: Not on file  . Number of children: Not on file  . Years of education: Not on file  . Highest education level: Not on file  Occupational History  . Not on file  Social Needs  . Financial resource strain: Not on file  . Food insecurity:    Worry: Not on file    Inability: Not on file  . Transportation needs:    Medical: Not on file    Non-medical: Not on file  Tobacco Use  . Smoking status: Never Smoker  . Smokeless tobacco: Never Used  Substance and Sexual Activity  . Alcohol use: No  . Drug use: No  . Sexual activity: Not on file   Lifestyle  . Physical activity:    Days per week: Not on file    Minutes per session: Not on file  . Stress: Not on file  Relationships  . Social connections:    Talks on phone: Not on file    Gets together: Not on file    Attends religious service: Not on file    Active member of club or organization: Not on file    Attends meetings of clubs or organizations: Not on file    Relationship status: Not on file  . Intimate partner violence:    Fear of current or ex partner: Not on file    Emotionally abused: Not on file    Physically abused: Not on file    Forced sexual activity: Not on file  Other Topics Concern  . Not on file  Social History Narrative  . Not on file    No Known Allergies  Outpatient Medications Prior to Visit  Medication Sig Dispense Refill  . acyclovir ointment (ZOVIRAX) 5 % APPLY 1 APPLICATION TOPICALLY EVERY 3 (THREE) HOURS. 30 g 1  . CAMILA 0.35 MG tablet     . ibuprofen (ADVIL,MOTRIN) 800 MG tablet Take 1 tablet (800 mg total) by mouth every 8 (eight) hours as needed for  mild pain or moderate pain. 15 tablet 0  . amoxicillin-clavulanate (AUGMENTIN) 875-125 MG tablet Take 1 tablet by mouth 2 (two) times daily. 20 tablet 0  . fluticasone (FLONASE) 50 MCG/ACT nasal spray Place 1 spray into both nostrils 2 (two) times daily. (Patient not taking: Reported on 01/16/2018) 16 g 0   No facility-administered medications prior to visit.     Review of Systems  Constitutional: Negative for chills, diaphoresis, fever, malaise/fatigue and weight loss.  HENT: Positive for congestion, ear pain, hoarse voice, sinus pressure and sore throat. Negative for ear discharge and sneezing.   Eyes: Negative for blurred vision.  Respiratory: Positive for shortness of breath. Negative for cough, sputum production and wheezing.   Cardiovascular: Negative for chest pain, palpitations and leg swelling.  Gastrointestinal: Negative for abdominal pain, blood in stool, constipation,  diarrhea, heartburn, melena and nausea.  Genitourinary: Negative for dysuria, frequency, hematuria and urgency.  Musculoskeletal: Negative for back pain, joint pain, myalgias and neck pain.  Skin: Negative for rash.  Neurological: Positive for headaches. Negative for dizziness, tingling, sensory change and focal weakness.  Endo/Heme/Allergies: Negative for environmental allergies and polydipsia. Does not bruise/bleed easily.  Psychiatric/Behavioral: Negative for depression and suicidal ideas. The patient is not nervous/anxious and does not have insomnia.      Objective  Vitals:   01/16/18 1631  BP: 130/80  Pulse: 80  Weight: 169 lb (76.7 kg)  Height: 5\' 6"  (1.676 m)    Physical Exam  Constitutional: She is oriented to person, place, and time. She appears well-developed and well-nourished.  HENT:  Head: Normocephalic.  Right Ear: Hearing, tympanic membrane and external ear normal.  Left Ear: Hearing, tympanic membrane and external ear normal.  Nose: Nose normal.  Mouth/Throat: Oropharynx is clear and moist. No oropharyngeal exudate, posterior oropharyngeal edema or posterior oropharyngeal erythema.  Eyes: Pupils are equal, round, and reactive to light. Conjunctivae and EOM are normal. Lids are everted and swept, no foreign bodies found. Left eye exhibits no hordeolum. No foreign body present in the left eye. Right conjunctiva is not injected. Left conjunctiva is not injected. No scleral icterus.  Neck: Normal range of motion. Neck supple. No JVD present. No tracheal deviation present. No thyromegaly present.  Cardiovascular: Normal rate, regular rhythm, normal heart sounds and intact distal pulses. Exam reveals no gallop and no friction rub.  No murmur heard. Pulmonary/Chest: Effort normal and breath sounds normal. No respiratory distress. She has no wheezes. She has no rales.  Abdominal: Soft. Bowel sounds are normal. She exhibits no mass. There is no hepatosplenomegaly. There is no  tenderness. There is no rebound and no guarding.  Musculoskeletal: Normal range of motion. She exhibits no edema or tenderness.  Lymphadenopathy:    She has no cervical adenopathy.  Neurological: She is alert and oriented to person, place, and time. She has normal strength. She displays normal reflexes. No cranial nerve deficit.  Skin: Skin is warm. No rash noted.  Psychiatric: She has a normal mood and affect. Her mood appears not anxious. She does not exhibit a depressed mood.  Nursing note and vitals reviewed.     Assessment & Plan  Problem List Items Addressed This Visit    None    Visit Diagnoses    Acute maxillary sinusitis, recurrence not specified    -  Primary   Subjective/objective c/w acute sinusitis/ Will treat with augmentin 875 bid for 10 days   Relevant Medications   amoxicillin-clavulanate (AUGMENTIN) 875-125 MG tablet  Meds ordered this encounter  Medications  . amoxicillin-clavulanate (AUGMENTIN) 875-125 MG tablet    Sig: Take 1 tablet by mouth 2 (two) times daily.    Dispense:  20 tablet    Refill:  0      Dr. Elizabeth Sauer [facility]  01/16/18

## 2018-01-18 DIAGNOSIS — L300 Nummular dermatitis: Secondary | ICD-10-CM | POA: Diagnosis not present

## 2018-03-08 DIAGNOSIS — M67432 Ganglion, left wrist: Secondary | ICD-10-CM | POA: Diagnosis not present

## 2018-06-18 ENCOUNTER — Ambulatory Visit
Admission: EM | Admit: 2018-06-18 | Discharge: 2018-06-18 | Disposition: A | Discharge status: Home / Self Care | Payer: BLUE CROSS/BLUE SHIELD | Attending: Family Medicine | Admitting: Family Medicine

## 2018-06-18 ENCOUNTER — Other Ambulatory Visit: Payer: Self-pay

## 2018-06-18 ENCOUNTER — Encounter: Payer: Self-pay | Admitting: Emergency Medicine

## 2018-06-18 DIAGNOSIS — K644 Residual hemorrhoidal skin tags: Secondary | ICD-10-CM | POA: Diagnosis not present

## 2018-06-18 MED ORDER — PREDNISONE 20 MG PO TABS: 20.0000 mg | ORAL_TABLET | Freq: Every day | ORAL | 0 refills | Status: DC

## 2018-06-18 MED ORDER — HYDROCORTISONE 2.5 % RE CREA: TOPICAL_CREAM | RECTAL | 0 refills | Status: DC

## 2018-06-18 NOTE — ED Triage Notes (Signed)
Patient c/o hemrroids that started a week ago.  Patient denies any rectal bleeding.

## 2018-06-18 NOTE — ED Provider Notes (Signed)
[facility]    CSN: [id number] Arrival date & time: 06/18/18  1403     History   Chief Complaint Chief Complaint  Patient presents with  . Hemorrhoids    HPI Barbara Schmidt is a 37 y.o. female.   37 yo female with a c/o painful hemorrhoid for one week. States has been trying Preparation H without any relief. Denies any bleeding.   The history is provided by the patient.    Past Medical History:  Diagnosis Date  . Allergy   . High-risk pregnancy   . Miscarriage    2  . Vaginal delivery     Patient Active Problem List   Diagnosis Date Noted  . 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 Sebaceous cyst 07/27/2016  . Blood pressure elevated without history of HTN 01/13/2016  . Environmental and seasonal allergies 12/17/2015  . H/O pre-term labor 12/17/2015    Past Surgical History:  Procedure Laterality Date  . ARTHROSCOPIC REPAIR ACL Right    high school   . CESAREAN SECTION  09/2015   1 vaginal and 1 c section    OB History   None      Home Medications    Prior to Admission medications   Medication Sig Start Date End Date Taking? Authorizing Provider  CAMILA 0.35 MG tablet  07/19/16  Yes [provider]  acyclovir ointment (ZOVIRAX) 5 % APPLY 1 APPLICATION TOPICALLY EVERY 3 (THREE) HOURS. 01/02/18   Reubin MilanBerglund, Laura H, MD  amoxicillin-clavulanate (AUGMENTIN) 875-125 MG tablet Take 1 tablet by mouth 2 (two) times daily. 01/16/18   Duanne LimerickJones, Deanna C, MD  hydrocortisone (ANUSOL-HC) 2.5 % rectal cream Apply rectally 2 times daily 06/18/18   Payton Mccallumonty, Ludmilla Mcgillis, MD  ibuprofen (ADVIL,MOTRIN) 800 MG tablet Take 1 tablet (800 mg total) by mouth every 8 (eight) hours as needed for mild pain or moderate pain. 08/30/16   Renford DillsMiller, Lindsey, NP  predniSONE (DELTASONE) 20 MG tablet Take 1 tablet (20 mg total) by mouth daily. 06/18/18   Payton Mccallumonty, Travious Vanover, MD      Family History Family History  Problem Relation Age of Onset  . Diabetes Maternal Grandmother   . Diabetes Paternal Uncle   . Kidney disease Paternal Uncle     Social History Social History   Tobacco Use  . Smoking status: Never Smoker  . Smokeless tobacco: Never Used  Substance Use Topics  . Alcohol use: No  . Drug use: No     Allergies   Patient has no known allergies.   Review of Systems Review of Systems   Physical Exam Triage Vital Signs ED Triage Vitals  Enc Vitals Group     BP 06/18/18 1439 131/85     Pulse Rate 06/18/18 1439 71     Resp 06/18/18 1439 16     Temp 06/18/18 1439 98 F (36.7 C)     Temp Source 06/18/18 1439 Oral     SpO2 06/18/18 1439 100 %     Weight 06/18/18 1437 160 lb (72.6 kg)     Height 06/18/18 1437 5\' 6"  (1.676 m)     Head Circumference --      Peak Flow --      Pain Score 06/18/18 1437 7     Pain Loc --      Pain Edu? --      Excl. in GC? --    No data  found.  Updated Vital Signs BP 131/85 (BP Location: Left Arm)   Pulse 71   Temp 98 F (36.7 C) (Oral)   Resp 16   Ht 5\' 6"  (1.676 m)   Wt 72.6 kg   LMP 06/01/2018 (Exact Date)   SpO2 100%   BMI 25.82 kg/m   Visual Acuity Right Eye Distance:   Left Eye Distance:   Bilateral Distance:    Right Eye Near:   Left Eye Near:    Bilateral Near:     Physical Exam  Genitourinary: Rectal exam shows external hemorrhoid (single 1.5cm, tender, inflamed).  Genitourinary Comments: Janee,CMA present during exam   Nursing note and vitals reviewed.    UC Treatments / Results  Labs (all labs ordered are listed, but only abnormal results are displayed) Labs Reviewed - No data to display  EKG None  Radiology No results found.  Procedures Procedures (including critical care time)  Medications Ordered in UC Medications - No data to display  Initial Impression / Assessment and Plan / UC Course  I have reviewed the triage vital signs and the nursing  notes.  Pertinent labs & imaging results that were available during my care of the patient were reviewed by me and considered in my medical decision making (see chart for details).      Final Clinical Impressions(s) / UC Diagnoses   Final diagnoses:  External hemorrhoid    ED Prescriptions    Medication Sig Dispense Auth. Provider   predniSONE (DELTASONE) 20 MG tablet Take 1 tablet (20 mg total) by mouth daily. 5 tablet [name], Pamala Hurry, MD   hydrocortisone (ANUSOL-HC) 2.5 % rectal cream Apply rectally 2 times daily 30 g [name], Pamala Hurry, MD     1. diagnosis reviewed with patient 2. rx as per orders above; reviewed possible side effects, interactions, risks and benefits  3. Recommend supportive treatment with sitz baths, TUCKS pads, pressure relief with donut cushion 4. Follow-up prn if symptoms worsen or don't improve    Controlled Substance Prescriptions [place] Controlled Substance Registry consulted? Not Applicable   Payton Mccallum, MD 06/18/18 1800

## 2018-06-20 ENCOUNTER — Other Ambulatory Visit (HOSPITAL_COMMUNITY)
Admission: RE | Admit: 2018-06-20 | Discharge: 2018-06-20 | Disposition: A | Discharge status: Home / Self Care | Payer: BLUE CROSS/BLUE SHIELD | Source: Ambulatory Visit | Attending: Internal Medicine | Admitting: Internal Medicine

## 2018-06-20 ENCOUNTER — Encounter: Payer: Self-pay | Admitting: Internal Medicine

## 2018-06-20 ENCOUNTER — Ambulatory Visit (INDEPENDENT_AMBULATORY_CARE_PROVIDER_SITE_OTHER): Payer: BLUE CROSS/BLUE SHIELD | Admitting: Internal Medicine

## 2018-06-20 VITALS — BP 132/78 | HR 74 | Ht 66.0 in | Wt 164.0 lb

## 2018-06-20 DIAGNOSIS — Z202 Contact with and (suspected) exposure to infections with a predominantly sexual mode of transmission: Secondary | ICD-10-CM | POA: Diagnosis not present

## 2018-06-20 DIAGNOSIS — K645 Perianal venous thrombosis: Secondary | ICD-10-CM | POA: Diagnosis not present

## 2018-06-20 NOTE — Progress Notes (Signed)
Date:  06/20/2018   Name:  Cristina GongSonya Lapidus   DOB:  Apr 21, 1981   MRN:  [id number]   Chief Complaint: Hemorrhoids; Contraception (Wants to change BC to a projesterone and estrogen tablet. ); and SEXUALLY TRANSMITTED DISEASE (Wants tested for gon/chla, also HIV and Syphillis)  HPI  Hemorrhoidal pain - seen in UC and given prednisone taper and anusol HC.  Also recommended tuck's pads, sitz baths, etc with no benefit.  Possible exposure to STI - pt requesting testing.  No specific sx.  Review of Systems  Constitutional: Negative for chills, fatigue and fever.  Respiratory: Negative for cough, chest tightness, wheezing and stridor.   Cardiovascular: Negative for chest pain and palpitations.  Gastrointestinal: Positive for rectal pain. Negative for anal bleeding and blood in stool.  Genitourinary: Negative for dysuria, menstrual problem and vaginal discharge.  Neurological: Negative for dizziness and headaches.  Hematological: Negative for adenopathy.    Patient Active Problem List   Diagnosis Date Noted  . 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 Sebaceous cyst 07/27/2016  . Blood pressure elevated without history of HTN 01/13/2016  . Environmental and seasonal allergies 12/17/2015  . H/O pre-term labor 12/17/2015    No Known Allergies  Past Surgical History:  Procedure Laterality Date  . ARTHROSCOPIC REPAIR ACL Right    high school   . CESAREAN SECTION  09/2015   1 vaginal and 1 c section    Social History   Tobacco Use  . Smoking status: Never Smoker  . Smokeless tobacco: Never Used  Substance Use Topics  . Alcohol use: No  . Drug use: No     Medication list has been reviewed and updated.  Current Meds  Medication Sig  . acyclovir ointment (ZOVIRAX) 5 % APPLY 1 APPLICATION TOPICALLY EVERY 3 (THREE) HOURS.  . hydrocortisone (ANUSOL-HC) 2.5 % rectal cream  Apply rectally 2 times daily  . ibuprofen (ADVIL,MOTRIN) 800 MG tablet Take 1 tablet (800 mg total) by mouth every 8 (eight) hours as needed for mild pain or moderate pain.  . predniSONE (DELTASONE) 20 MG tablet Take 1 tablet (20 mg total) by mouth daily.    PHQ 2/9 Scores 01/16/2018 12/17/2015  PHQ - 2 Score 0 0  PHQ- 9 Score 0 -    Physical Exam  Constitutional: She is oriented to person, place, and time. She appears well-developed. No distress.  HENT:  Head: Normocephalic and atraumatic.  Cardiovascular: Normal rate, regular rhythm and normal heart sounds.  Pulmonary/Chest: Effort normal and breath sounds normal. No respiratory distress.  Abdominal: Soft.  Genitourinary: Rectal exam shows tenderness. External hemorrhoid: thrombosed, 1 cm size.  Musculoskeletal: Normal range of motion.  Neurological: She is alert and oriented to person, place, and time.  Skin: Skin is warm and dry. No rash noted.  Psychiatric: She has a normal mood and affect. Her behavior is normal. Thought content normal.  Nursing note and vitals reviewed.   BP 132/78 (BP Location: Right Arm, Patient Position: Sitting, Cuff Size: Normal)   Pulse 74   Ht 5\' 6"  (1.676 m)   Wt 164 lb (74.4 kg)   LMP 06/01/2018 (Exact Date)   SpO2 100%   BMI 26.47 kg/m   Assessment and Plan: 1. Thrombosed external hemorrhoid Continue current care - Ambulatory referral to General Surgery - appt tomorrow at 2:45  2. Possible exposure to STD Labs drawn - GC/Chlamydia probe amp ([facility])not at [facility] - HIV Antibody (routine testing w rflx) - [facility]   Partially dictated using Animal nutritionist. Any errors are unintentional.  Bari Edward, MD [facility]  06/20/2018

## 2018-06-21 ENCOUNTER — Other Ambulatory Visit: Payer: Self-pay

## 2018-06-21 ENCOUNTER — Ambulatory Visit (INDEPENDENT_AMBULATORY_CARE_PROVIDER_SITE_OTHER): Payer: BLUE CROSS/BLUE SHIELD | Admitting: General Surgery

## 2018-06-21 ENCOUNTER — Encounter: Payer: Self-pay | Admitting: General Surgery

## 2018-06-21 VITALS — BP 143/97 | HR 79 | Temp 97.9°F | Ht 66.0 in | Wt 163.0 lb

## 2018-06-21 DIAGNOSIS — K645 Perianal venous thrombosis: Secondary | ICD-10-CM | POA: Diagnosis not present

## 2018-06-21 LAB — RPR: RPR: NONREACTIVE

## 2018-06-21 LAB — GC/CHLAMYDIA PROBE AMP (~~LOC~~) NOT AT ARMC
CHLAMYDIA, DNA PROBE: NEGATIVE
NEISSERIA GONORRHEA: NEGATIVE

## 2018-06-21 LAB — HIV ANTIBODY (ROUTINE TESTING W REFLEX): HIV SCREEN 4TH GENERATION: NONREACTIVE

## 2018-06-21 NOTE — Progress Notes (Signed)
Incision and Drainage Procedure Note  Pre-operative Diagnosis: thrombosed external hemorrhoid  Post-operative Diagnosis: same  Indications: painful thrombosed external hemorrhoid  Anesthesia: 1% lidocaine with epinephrine  Procedure Details  The procedure, risks and complications have been discussed in detail (including, but not limited to airway compromise, infection, bleeding) with the patient, and the patient has signed consent to the procedure.  The skin was prepped over the affected area in the usual fashion. After adequate local anesthesia, I&D with a # 15 blade was performed with expulsion of fresh clotted blood The patient was observed until stable.  Findings: Fresh clot removed  EBL:  Negligible (<2 cc)  Drains: non3  Condition: Tolerated procedure well   Complications: none.

## 2018-06-21 NOTE — Progress Notes (Signed)
Patient ID: Barbara Schmidt, female   DOB: 1980-09-11, 37 y.o.   MRN: [id number]  Chief Complaint  Patient presents with  . Hemorrhoids    HPI Barbara Schmidt is a 37 y.o. female.   HPI she reports that she noticed pain in her perianal area about a week and a half ago.  She has had hemorrhoids in the past but states that they resolved on their own without any specific treatment.  On Sunday she says the pain became worse and she presented to urgent care.  Their note describes swelling but no description of a thrombosed hemorrhoid is contained in their documentation.  They recommended Anusol, Tucks pads, sitz baths.  When her pain did not resolve, she saw her primary care provider in [place].  This note does describe a thrombosed external hemorrhoid and an appointment was made to see general surgery immediately.  Barbara Schmidt does endorse chronic constipation; she does not have a home bowel regimen that she follows.  She has been performing the sitz baths and applying the Anusol cream but has not had any significant relief from her pain.  He has never had any bleeding from her hemorrhoids.  She denies fevers chills or other systemic symptoms.  Past Medical History:  Diagnosis Date  . Allergy   . High-risk pregnancy   . Miscarriage    2  . Vaginal delivery     Past Surgical History:  Procedure Laterality Date  . ARTHROSCOPIC REPAIR ACL Right    high school   . CESAREAN SECTION  09/2015   1 vaginal and 1 c section    Family History  Problem Relation Age of Onset  . Diabetes Maternal Grandmother   . Diabetes Paternal Uncle   . Kidney disease Paternal Uncle     Social History Social History   Tobacco Use  . Smoking status: Never Smoker  . Smokeless tobacco: Never Used  Substance Use Topics  . Alcohol use: No  . Drug use: No    No Known Allergies  Current Outpatient Medications  Medication Sig Dispense Refill  . acyclovir ointment (ZOVIRAX) 5 % APPLY 1 APPLICATION TOPICALLY EVERY 3  (THREE) HOURS. 30 g 1  . hydrocortisone (ANUSOL-HC) 2.5 % rectal cream Apply rectally 2 times daily 30 g 0  . ibuprofen (ADVIL,MOTRIN) 800 MG tablet Take 1 tablet (800 mg total) by mouth every 8 (eight) hours as needed for mild pain or moderate pain. 15 tablet 0  . predniSONE (DELTASONE) 20 MG tablet Take 1 tablet (20 mg total) by mouth daily. 5 tablet 0   No current facility-administered medications for this visit.     Review of Systems Review of Systems  All other systems reviewed and are negative. or as discussed in the HPI  Blood pressure (!) 143/97, pulse 79, temperature 97.9 F (36.6 C), temperature source Skin, height 5\' 6"  (1.676 m), weight 163 lb (73.9 kg), last menstrual period 06/01/2018, SpO2 98 %.  Physical Exam Physical Exam  Constitutional: She is oriented to person, place, and time. She appears well-developed and well-nourished.  Clearly uncomfortable.  HENT:  Head: Normocephalic and atraumatic.  Mouth/Throat: Oropharynx is clear and moist. No oropharyngeal exudate.  Eyes: Pupils are equal, round, and reactive to light. Conjunctivae are normal. No scleral icterus.  Neck: Normal range of motion. Neck supple. No tracheal deviation present. No thyromegaly present.  Cardiovascular: Normal rate, regular rhythm and normal heart sounds.  Pulmonary/Chest: Effort normal and breath sounds normal. No respiratory distress.  Abdominal: Soft.  Bowel sounds are normal.  Genitourinary:     Genitourinary Comments: Thrombosed external hemorrhoid.  Exquisitely painful.  Musculoskeletal: She exhibits no edema, tenderness or deformity.  Lymphadenopathy:    She has no cervical adenopathy.  Neurological: She is alert and oriented to person, place, and time.  Skin: Skin is warm and dry.  Psychiatric: She has a normal mood and affect.    Data Reviewed EMR with urgent care and PCP clinic notes reviewed.  Assessment    37 y/o F with thrombosed external hemorrhoid.  The timeframe of  her symptoms suggests that initially, she had just an irritated hemorrhoid that later thrombosed.  It isn't clear whether the thrombosis occurred less than 72 hours ago, however the UC documentation does not relate thrombosis, whereas PCP documentation less than 24 hours ago does.      Plan    After discussing the possibility that if the thrombosis had been present for greater than 72 hours, clot evacuation was unlikely to be effective or helpful, Barbara Schmidt felt that she would prefer to take the chance that it may resolve her severe rectal pain.  Consent was signed and bedside incision and evacuation of thrombosed hemorrhoid performed.  See separate procedure note. Based upon the findings during the procedure, it seems likely that the thrombosis occurred within the past 72 hours or less.  Patient given instructions for care at home.  RTC PRN.        Duanne GuessJennifer Lakesia Dahle 06/21/2018, 3:48 PM

## 2018-06-21 NOTE — Patient Instructions (Addendum)
Use Anusol until12/07/2017  Use PREPARATION H on 06/26/2018.    How to Take a Sitz Bath A sitz bath is a warm water bath that is taken while you are sitting down. The water should only come up to your hips and should cover your buttocks. Your health care provider may recommend a sitz bath to help you:  Clean the lower part of your body, including your genital area.  With itching.  With pain.  With sore muscles or muscles that tighten or spasm.  How to take a sitz bath Take 3-4 sitz baths per day or as told by your health care provider. 1. Partially fill a bathtub with warm water. You will only need the water to be deep enough to cover your hips and buttocks when you are sitting in it. 2. If your health care provider told you to put medicine in the water, follow the directions exactly. 3. Sit in the water and open the tub drain a little. 4. Turn on the warm water again to keep the tub at the correct level. Keep the water running constantly. 5. Soak in the water for 15-20 minutes or as told by your health care provider. 6. After the sitz bath, pat the affected area dry first. Do not rub it. 7. Be careful when you stand up after the sitz bath because you may feel dizzy.  Contact a health care provider if:  Your symptoms get worse. Do not continue with sitz baths if your symptoms get worse.  You have new symptoms. Do not continue with sitz baths until you talk with your health care provider. This information is not intended to replace advice given to you by your health care provider. Make sure you discuss any questions you have with your health care provider. Document Released: 04/03/2004 Document Revised: 12/10/2015 Document Reviewed: 07/10/2014 Elsevier Interactive Patient Education  [employer]2018 Elsevier Inc.    The patient is aware to call back for any questions or concerns.

## 2018-06-26 DIAGNOSIS — N898 Other specified noninflammatory disorders of vagina: Secondary | ICD-10-CM | POA: Diagnosis not present

## 2018-06-26 DIAGNOSIS — K648 Other hemorrhoids: Secondary | ICD-10-CM | POA: Diagnosis not present

## 2018-06-30 DIAGNOSIS — K644 Residual hemorrhoidal skin tags: Secondary | ICD-10-CM | POA: Diagnosis not present

## 2018-07-28 ENCOUNTER — Ambulatory Visit (INDEPENDENT_AMBULATORY_CARE_PROVIDER_SITE_OTHER): Payer: BLUE CROSS/BLUE SHIELD | Admitting: Internal Medicine

## 2018-07-28 ENCOUNTER — Encounter: Payer: Self-pay | Admitting: Internal Medicine

## 2018-07-28 VITALS — BP 122/82 | HR 77 | Resp 16 | Ht 66.0 in | Wt 165.0 lb

## 2018-07-28 DIAGNOSIS — B3731 Acute candidiasis of vulva and vagina: Secondary | ICD-10-CM

## 2018-07-28 DIAGNOSIS — N3000 Acute cystitis without hematuria: Secondary | ICD-10-CM

## 2018-07-28 DIAGNOSIS — Z0000 Encounter for general adult medical examination without abnormal findings: Secondary | ICD-10-CM

## 2018-07-28 DIAGNOSIS — B373 Candidiasis of vulva and vagina: Secondary | ICD-10-CM | POA: Diagnosis not present

## 2018-07-28 MED ORDER — CIPROFLOXACIN HCL 250 MG PO TABS: 250.0000 mg | ORAL_TABLET | Freq: Two times a day (BID) | ORAL | 0 refills | Status: AC

## 2018-07-28 MED ORDER — FLUCONAZOLE 100 MG PO TABS: 100.0000 mg | ORAL_TABLET | Freq: Every day | ORAL | 0 refills | Status: AC

## 2018-07-28 NOTE — Progress Notes (Signed)
Date:  07/28/2018   Name:  Barbara Schmidt   DOB:  1980-08-18   MRN:  [id number]   Chief Complaint: Dysuria (burning with urinating 3 days now also concerned may have yeast inf )  Dysuria   This is a new problem. The current episode started in the past 7 days. The problem occurs every urination. The problem has been unchanged. The quality of the pain is described as burning. The pain is mild. There has been no fever. Pertinent negatives include no chills or hematuria.  Vaginal Discharge  The patient's primary symptoms include genital itching and vaginal discharge. The patient's pertinent negatives include no pelvic pain. Associated symptoms include dysuria. Pertinent negatives include no chills, fever, headaches or hematuria.   Pt also presents a form for reduction in insurance premium.  She needs a glucose and lipids.  She is not fasting today so will need to come back fasting next week.  Review of Systems  Constitutional: Negative for chills, fatigue and fever.  Respiratory: Negative for chest tightness and shortness of breath.   Cardiovascular: Negative for chest pain.  Genitourinary: Positive for dysuria and vaginal discharge. Negative for hematuria and pelvic pain.  Neurological: Negative for dizziness and headaches.    Patient Active Problem List   Diagnosis Date Noted  . 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 Sebaceous cyst 07/27/2016  . Blood pressure elevated without history of HTN 01/13/2016  . Environmental and seasonal allergies 12/17/2015    No Known Allergies  Past Surgical History:  Procedure Laterality Date  . ARTHROSCOPIC REPAIR ACL Right    high school   . CESAREAN SECTION  09/2015   1 vaginal and 1 c section    Social History   Tobacco Use  . Smoking status: Never Smoker  . Smokeless tobacco: Never Used  Substance Use Topics  . Alcohol use:  No  . Drug use: No     Medication list has been reviewed and updated.  Current Meds  Medication Sig  . acyclovir ointment (ZOVIRAX) 5 % APPLY 1 APPLICATION TOPICALLY EVERY 3 (THREE) HOURS.  . hydrocortisone (ANUSOL-HC) 2.5 % rectal cream Apply rectally 2 times daily  . ibuprofen (ADVIL,MOTRIN) 800 MG tablet Take 1 tablet (800 mg total) by mouth every 8 (eight) hours as needed for mild pain or moderate pain.  . [DISCONTINUED] predniSONE (DELTASONE) 20 MG tablet Take 1 tablet (20 mg total) by mouth daily.    PHQ 2/9 Scores 01/16/2018 12/17/2015  PHQ - 2 Score 0 0  PHQ- 9 Score 0 -    Physical Exam Vitals signs and nursing note reviewed.  Constitutional:      General: She is not in acute distress.    Appearance: She is well-developed.  Cardiovascular:     Rate and Rhythm: Normal rate and regular rhythm.     Heart sounds: Normal heart sounds.  Pulmonary:     Effort: Pulmonary effort is normal. No respiratory distress.     Breath sounds: Normal breath sounds.  Abdominal:     General: Bowel sounds are normal.     Palpations: Abdomen is soft.     Tenderness: There is abdominal tenderness in the suprapubic area. There is no guarding or rebound.  Neurological:     Mental Status: She is alert.     BP 122/82   Pulse 74   Resp 16   Ht 5\' 6"  (1.676  m)   Wt 165 lb (74.8 kg)   SpO2 (!) 77%   BMI 26.63 kg/m   Assessment and Plan: 1. Acute cystitis without hematuria Pt unable to void so will treat empirically - if not improvement after treatment, will need to return for Ua - ciprofloxacin (CIPRO) 250 MG tablet; Take 1 tablet (250 mg total) by mouth 2 (two) times daily for 7 days.  Dispense: 14 tablet; Refill: 0  2. Yeast vaginitis - fluconazole (DIFLUCAN) 100 MG tablet; Take 1 tablet (100 mg total) by mouth daily for 2 days. Allow 2 days between doses  Dispense: 2 tablet; Refill: 0  3. Routine general medical examination at a health care facility Labs needed for insurance  form - Basic metabolic panel - Lipid panel   Partially dictated using Dragon software. Any errors are unintentional.  Bari Edward, MD [facility]  07/28/2018

## 2018-08-08 DIAGNOSIS — Z0000 Encounter for general adult medical examination without abnormal findings: Secondary | ICD-10-CM | POA: Diagnosis not present

## 2018-08-09 LAB — BASIC METABOLIC PANEL
BUN/Creatinine Ratio: 14 (ref 9–23)
BUN: 11 mg/dL (ref 6–20)
CHLORIDE: 104 mmol/L (ref 96–106)
CO2: 23 mmol/L (ref 20–29)
Calcium: 9.2 mg/dL (ref 8.7–10.2)
Creatinine, Ser: 0.8 mg/dL (ref 0.57–1.00)
GFR calc Af Amer: 110 mL/min/{1.73_m2} (ref >59)
GFR calc non Af Amer: 96 mL/min/{1.73_m2} (ref >59)
GLUCOSE: 97 mg/dL (ref 65–99)
POTASSIUM: 4 mmol/L (ref 3.5–5.2)
SODIUM: 139 mmol/L (ref 134–144)

## 2018-08-09 LAB — LIPID PANEL
CHOLESTEROL TOTAL: 140 mg/dL (ref 100–199)
Chol/HDL Ratio: 2.2 ratio (ref 0.0–4.4)
HDL: 63 mg/dL (ref >39)
LDL Calculated: 63 mg/dL (ref 0–99)
TRIGLYCERIDES: 68 mg/dL (ref 0–149)
VLDL Cholesterol Cal: 14 mg/dL (ref 5–40)

## 2018-08-09 NOTE — Progress Notes (Signed)
Patient informed. Sent insurance form to her insurance company by [employer] and placed in scan pile to be scanned.

## 2018-08-15 DIAGNOSIS — K644 Residual hemorrhoidal skin tags: Secondary | ICD-10-CM | POA: Diagnosis not present

## 2018-08-15 DIAGNOSIS — K648 Other hemorrhoids: Secondary | ICD-10-CM | POA: Diagnosis not present

## 2018-08-22 ENCOUNTER — Ambulatory Visit: Payer: Self-pay | Admitting: General Surgery

## 2018-08-22 DIAGNOSIS — K648 Other hemorrhoids: Secondary | ICD-10-CM | POA: Diagnosis not present

## 2018-08-22 DIAGNOSIS — K644 Residual hemorrhoidal skin tags: Secondary | ICD-10-CM | POA: Diagnosis not present

## 2018-08-22 NOTE — H&P (Signed)
HISTORY OF PRESENT ILLNESS:    Barbara Schmidt is a 38 y.o.female patient who comes for re evaluation of hemorrhoids.   Patient with history of external hemorrhoids status post incision and drainage. Patient has been tried to treat internal and external hemorrhoids with conservative management with fiber supplement, hydrocortisone suppositories, sitz bath, laxatives among others. She continue to have significant pain and bleeding. Pain does not radiates. Pain aggravates with sitting down and bowel movements. Sitz bath and suppositories are not alleviating pain any more. Denies fever or chills.       PAST MEDICAL HISTORY:      Past Medical History:  Diagnosis Date  . Anemia    W pregnancy  . Antithrombin III deficiency (CMS-HCC) 07/27/2016   during pregancy  . Cervical incompetence 01/13/2016   2 prior midtrimester losses at 2-+wks; then 2 pregnancies with ppx cerclages, 17-P. 36wk del after removal, 32wks del for fetal indications.  . Environmental and seasonal allergies 12/17/2015  . 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 Methylene THF reductase deficiency and homocystinuria (CMS-HCC) 07/27/2016   during pregnancy  . Sebaceous cyst 07/27/2016   Overview:  44mm cyst left scalp -        PAST SURGICAL HISTORY:   Past Surgical History:  Procedure Laterality Date  . CERVICAL CERCLAGE VAGINAL    . CESAREAN SECTION    . OTHER SURGERY  06/14/2018   Had a clot drained from external Hemorrhoid         MEDICATIONS:  EncounterMedications        Outpatient Encounter Medications as of 08/22/2018  Medication Sig Dispense Refill  . acyclovir (ZOVIRAX) 5 % ointment Apply 1 Application topically once daily as needed    . docusate (COLACE) 100 MG capsule Take 1 capsule (100 mg total) by mouth 2 (two) times daily. 60 capsule 11  . drospirenone-ethinyl estradiol (YAZ) 3-0.02 mg tablet Take 1 tablet by mouth once daily 3 Package 3  . hydrocortisone (ANUSOL-HC) 25 mg  suppository Place 1 suppository (25 mg total) rectally 2 (two) times daily 28 suppository 1  . ibuprofen (MOTRIN) 800 MG tablet Take 1 tablet (800 mg total) by mouth every 8 (eight) hours as needed for Pain 45 tablet 11   No facility-administered encounter medications on file as of 08/22/2018.        ALLERGIES:   Patient has no known allergies.   SOCIAL HISTORY:  Social History          Socioeconomic History  . Marital status: Married    Spouse name: Not on file  . Number of children: Not on file  . Years of education: Not on file  . Highest education level: Not on file  Occupational History  . Occupation: counseler  Social Needs  . Financial resource strain: Not on file  . Food insecurity:    Worry: Not on file    Inability: Not on file  . Transportation needs:    Medical: Not on file    Non-medical: Not on file  Tobacco Use  . Smoking status: Never Smoker  . Smokeless tobacco: Never Used  Substance and Sexual Activity  . Alcohol use: No  . Drug use: No  . Sexual activity: Yes    Partners: Male    Birth control/protection: None  Other Topics Concern  . Not on file  Social History Narrative  . Not on file      FAMILY HISTORY:  Family History  Problem Relation Age of  Onset  . Diabetes Mother   . High blood pressure (Hypertension) Father   . Osteoporosis (Thinning of bones) Father   . Diabetes Maternal Grandmother   . Kidney failure Maternal Grandmother   . High blood pressure (Hypertension) Maternal Grandmother      GENERAL REVIEW OF SYSTEMS:   General ROS: negative for - chills, fatigue, fever, weight gain or weight loss Allergy and Immunology ROS: negative for - hives  Hematological and Lymphatic ROS: negative for - bleeding problems or bruising, negative for palpable nodes Endocrine ROS: negative for - heat or cold intolerance, hair changes Respiratory ROS: negative for - cough, shortness of breath or wheezing Cardiovascular  ROS: no chest pain or palpitations GI ROS: negative for nausea, vomiting, abdominal pain, diarrhea, constipation Musculoskeletal ROS: negative for - joint swelling or muscle pain Neurological ROS: negative for - confusion, syncope Dermatological ROS: negative for pruritus and rash  PHYSICAL EXAM:     Vitals:   08/22/18 0810  BP: 138/89  Pulse: 69  .  Ht:170.2 cm (5' 7") Wt:75.3 kg (166 lb 0.1 oz) BSA:Body surface area is 1.89 meters squared. Body mass index is 26 kg/m..   GENERAL: Alert, active, oriented x3  LUNGS: Sound clear with no rales rhonchi or wheezes.  HEART: Regular rhythm S1 and S2 without murmur.  ABDOMEN: Soft and depressible, nontender with no palpable mass, no hepatomegaly.  RECTAL: internal and external hemorrhoid. No thrombosed hemorrhoids. No bleeding. Adequate rectal tone.  EXTREMITIES: Well-developed well-nourished symmetrical with no dependent edema.  NEUROLOGICAL: Awake alert oriented, facial expression symmetrical, moving all extremities.      IMPRESSION:     Internal and external bleeding hemorrhoids [K64.4, K64.8]   - Failure of medical management with fiber supplement, hydrocortisone suppositories, sitz bath, laxative   - Significant pain. No fissure seen. No thrombosed hemorrhoids recurrence.    - Long conversation about hemorrhoidectomy, benefits and risk. Recovery was also discussed with long period of pain and bleeding.         PLAN:  1. Ractal Exam under anesthesia (45990) 2. Hemorrhoidectomy (46260) 3. Bowel prep the day before surgery 4. CBC 5. CMP done on 08/08/2018 6. Contact us if has any question or concern.   Patient verbalized understanding, all questions were answered, and were agreeable with the plan outlined above.   Devynne Sturdivant Cintron-Diaz, MD  Electronically signed by Li Bobo Cintron-Diaz, MD  

## 2018-08-22 NOTE — H&P (View-Only) (Signed)
HISTORY OF PRESENT ILLNESS:    Barbara Schmidt is a 38 y.o.female patient who comes for re evaluation of hemorrhoids.   Patient with history of external hemorrhoids status post incision and drainage. Patient has been tried to treat internal and external hemorrhoids with conservative management with fiber supplement, hydrocortisone suppositories, sitz bath, laxatives among others. She continue to have significant pain and bleeding. Pain does not radiates. Pain aggravates with sitting down and bowel movements. Sitz bath and suppositories are not alleviating pain any more. Denies fever or chills.       PAST MEDICAL HISTORY:      Past Medical History:  Diagnosis Date  . Anemia    W pregnancy  . Antithrombin III deficiency (CMS-HCC) 07/27/2016   during pregancy  . Cervical incompetence 01/13/2016   2 prior midtrimester losses at 2-+wks; then 2 pregnancies with ppx cerclages, 17-P. 36wk del after removal, 32wks del for fetal indications.  . Environmental and seasonal allergies 12/17/2015  . 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 Methylene THF reductase deficiency and homocystinuria (CMS-HCC) 07/27/2016   during pregnancy  . Sebaceous cyst 07/27/2016   Overview:  44mm cyst left scalp -        PAST SURGICAL HISTORY:   Past Surgical History:  Procedure Laterality Date  . CERVICAL CERCLAGE VAGINAL    . CESAREAN SECTION    . OTHER SURGERY  06/14/2018   Had a clot drained from external Hemorrhoid         MEDICATIONS:  EncounterMedications        Outpatient Encounter Medications as of 08/22/2018  Medication Sig Dispense Refill  . acyclovir (ZOVIRAX) 5 % ointment Apply 1 Application topically once daily as needed    . docusate (COLACE) 100 MG capsule Take 1 capsule (100 mg total) by mouth 2 (two) times daily. 60 capsule 11  . drospirenone-ethinyl estradiol (YAZ) 3-0.02 mg tablet Take 1 tablet by mouth once daily 3 Package 3  . hydrocortisone (ANUSOL-HC) 25 mg  suppository Place 1 suppository (25 mg total) rectally 2 (two) times daily 28 suppository 1  . ibuprofen (MOTRIN) 800 MG tablet Take 1 tablet (800 mg total) by mouth every 8 (eight) hours as needed for Pain 45 tablet 11   No facility-administered encounter medications on file as of 08/22/2018.        ALLERGIES:   Patient has no known allergies.   SOCIAL HISTORY:  Social History          Socioeconomic History  . Marital status: Married    Spouse name: Not on file  . Number of children: Not on file  . Years of education: Not on file  . Highest education level: Not on file  Occupational History  . Occupation: counseler  Social Needs  . Financial resource strain: Not on file  . Food insecurity:    Worry: Not on file    Inability: Not on file  . Transportation needs:    Medical: Not on file    Non-medical: Not on file  Tobacco Use  . Smoking status: Never Smoker  . Smokeless tobacco: Never Used  Substance and Sexual Activity  . Alcohol use: No  . Drug use: No  . Sexual activity: Yes    Partners: Male    Birth control/protection: None  Other Topics Concern  . Not on file  Social History Narrative  . Not on file      FAMILY HISTORY:  Family History  Problem Relation Age of  Onset  . Diabetes Mother   . High blood pressure (Hypertension) Father   . Osteoporosis (Thinning of bones) Father   . Diabetes Maternal Grandmother   . Kidney failure Maternal Grandmother   . High blood pressure (Hypertension) Maternal Grandmother      GENERAL REVIEW OF SYSTEMS:   General ROS: negative for - chills, fatigue, fever, weight gain or weight loss Allergy and Immunology ROS: negative for - hives  Hematological and Lymphatic ROS: negative for - bleeding problems or bruising, negative for palpable nodes Endocrine ROS: negative for - heat or cold intolerance, hair changes Respiratory ROS: negative for - cough, shortness of breath or wheezing Cardiovascular  ROS: no chest pain or palpitations GI ROS: negative for nausea, vomiting, abdominal pain, diarrhea, constipation Musculoskeletal ROS: negative for - joint swelling or muscle pain Neurological ROS: negative for - confusion, syncope Dermatological ROS: negative for pruritus and rash  PHYSICAL EXAM:     Vitals:   08/22/18 0810  BP: 138/89  Pulse: 69  .  Ht:170.2 cm (5\' 7" ) Wt:75.3 kg (166 lb 0.1 oz) CHY:IFOY surface area is 1.89 meters squared. Body mass index is 26 kg/m.Marland Kitchen   GENERAL: Alert, active, oriented x3  LUNGS: Sound clear with no rales rhonchi or wheezes.  HEART: Regular rhythm S1 and S2 without murmur.  ABDOMEN: Soft and depressible, nontender with no palpable mass, no hepatomegaly.  RECTAL: internal and external hemorrhoid. No thrombosed hemorrhoids. No bleeding. Adequate rectal tone.  EXTREMITIES: Well-developed well-nourished symmetrical with no dependent edema.  NEUROLOGICAL: Awake alert oriented, facial expression symmetrical, moving all extremities.      IMPRESSION:     Internal and external bleeding hemorrhoids [K64.4, K64.8]   - Failure of medical management with fiber supplement, hydrocortisone suppositories, sitz bath, laxative   - Significant pain. No fissure seen. No thrombosed hemorrhoids recurrence.    - Long conversation about hemorrhoidectomy, benefits and risk. Recovery was also discussed with long period of pain and bleeding.         PLAN:  1. Ractal Exam under anesthesia (77412) 2. Hemorrhoidectomy (87867) 3. Bowel prep the day before surgery 4. CBC 5. CMP done on 08/08/2018 6. Contact us if has any question or concern.   Patient verbalized understanding, all questions were answered, and were agreeable with the plan outlined above.   Carolan Shiver, MD  Electronically signed by Carolan Shiver, MD

## 2018-08-23 ENCOUNTER — Inpatient Hospital Stay: Admission: RE | Admit: 2018-08-23 | Payer: BLUE CROSS/BLUE SHIELD | Source: Ambulatory Visit

## 2018-08-24 ENCOUNTER — Other Ambulatory Visit: Payer: Self-pay

## 2018-08-24 ENCOUNTER — Encounter
Admission: RE | Admit: 2018-08-24 | Discharge: 2018-08-24 | Disposition: A | Discharge status: Home / Self Care | Payer: BLUE CROSS/BLUE SHIELD | Source: Ambulatory Visit | Attending: General Surgery | Admitting: General Surgery

## 2018-08-24 HISTORY — DX: Headache, unspecified: R51.9

## 2018-08-24 HISTORY — DX: Endocrine, nutritional and metabolic diseases complicating pregnancy, unspecified trimester: E72.12

## 2018-08-24 HISTORY — DX: Anemia, unspecified: D64.9

## 2018-08-24 HISTORY — DX: Adverse effect of unspecified anesthetic, initial encounter: T41.45XA

## 2018-08-24 HISTORY — DX: Other complications of anesthesia, initial encounter: T88.59XA

## 2018-08-24 HISTORY — DX: Other primary thrombophilia: D68.59

## 2018-08-24 HISTORY — DX: Endocrine, nutritional and metabolic diseases complicating pregnancy, unspecified trimester: O99.280

## 2018-08-24 HISTORY — DX: Headache: R51

## 2018-08-24 MED ORDER — METRONIDAZOLE IN NACL 5-0.79 MG/ML-% IV SOLN
500.0000 mg | INTRAVENOUS | Status: AC
  Administered 2018-08-25: 500 mg via INTRAVENOUS
  Filled 2018-08-24: qty 100

## 2018-08-24 MED ORDER — CIPROFLOXACIN IN D5W 400 MG/200ML IV SOLN
400.0000 mg | INTRAVENOUS | Status: AC
  Administered 2018-08-25: 400 mg via INTRAVENOUS

## 2018-08-24 NOTE — Patient Instructions (Signed)
Your procedure is scheduled on: 08-25-18 Report to Same Day Surgery 2nd floor medical mall [facility] [address] Entrance-take elevator on left to 2nd floor.  Check in with surgery information desk.) @ 8 am   Remember: Instructions that are not followed completely may result in serious medical risk, up to and including death, or upon the discretion of your surgeon and anesthesiologist your surgery may need to be rescheduled.    _x___ 1. Do not eat food after midnight the night before your procedure. You may drink clear liquids up to 2 hours before you are scheduled to arrive at the hospital for your procedure.  Do not drink clear liquids within 2 hours of your scheduled arrival to the hospital.  Clear liquids include  --Water or Apple juice without pulp  --Clear carbohydrate beverage such as ClearFast or Gatorade  --Black Coffee or Clear Tea (No milk, no creamers, do not add anything to  the coffee or Tea   ____Ensure clear carbohydrate drink on the way to the hospital for bariatric patients  ____Ensure clear carbohydrate drink 3 hours before surgery for Dr Rutherford Nail patients if physician instructed.   No gum chewing or hard candies.     __x__ 2. No Alcohol for 24 hours before or after surgery.   __x__3. No Smoking or e-cigarettes for 24 prior to surgery.  Do not use any chewable tobacco products for at least 6 hour prior to surgery   ____  4. Bring all medications with you on the day of surgery if instructed.    __x__ 5. Notify your doctor if there is any change in your medical condition     (cold, fever, infections).    x___6. On the morning of surgery brush your teeth with toothpaste and water.  You may rinse your mouth with mouth wash if you wish.  Do not swallow any toothpaste or mouthwash.   Do not wear jewelry, make-up, hairpins, clips or nail polish.  Do not wear lotions, powders, or perfumes. You may wear deodorant.  Do not shave 48 hours prior to surgery. Men may shave face and  neck.  Do not bring valuables to the hospital.    [facility] is not responsible for any belongings or valuables.               Contacts, dentures or bridgework may not be worn into surgery.  Leave your suitcase in the car. After surgery it may be brought to your room.  For patients admitted to the hospital, discharge time is determined by your  treatment team.  _  Patients discharged the day of surgery will not be allowed to drive home.  You will need someone to drive you home and stay with you the night of your procedure.    Please read over the following fact sheets that you were given:   [facility] Preparing for Surgery   ____ Take anti-hypertensive listed below, cardiac, seizure, asthma, anti-reflux and psychiatric medicines. These include:  1. none  2.  3.  4.  5.  6.  ____Fleets enema or Magnesium Citrate as directed.   ____ Use CHG Soap or sage wipes as directed on instruction sheet   ____ Use inhalers on the day of surgery and bring to hospital day of surgery  ____ Stop Metformin and Janumet 2 days prior to surgery.    ____ Take 1/2 of usual insulin dose the night before surgery and none on the morning     surgery.   ____  Follow recommendations from Cardiologist, Pulmonologist or PCP regarding          stopping Aspirin, Coumadin, Plavix ,Eliquis, Effient, or Pradaxa, and Pletal.  X____Stop Anti-inflammatories such as Advil, Aleve, Ibuprofen, Motrin, Naproxen, Naprosyn, Goodies powders or aspirin products now-OK to take Tylenol   ____ Stop supplements until after surgery.     ____ Bring C-Pap to the hospital.

## 2018-08-25 ENCOUNTER — Ambulatory Visit: Payer: BLUE CROSS/BLUE SHIELD | Admitting: Anesthesiology

## 2018-08-25 ENCOUNTER — Other Ambulatory Visit: Payer: Self-pay

## 2018-08-25 ENCOUNTER — Encounter
Admission: RE | Disposition: A | Discharge status: Home / Self Care | Payer: Self-pay | Source: Home / Self Care | Attending: General Surgery

## 2018-08-25 ENCOUNTER — Ambulatory Visit
Admission: RE | Admit: 2018-08-25 | Discharge: 2018-08-25 | Disposition: A | Discharge status: Home / Self Care | Payer: BLUE CROSS/BLUE SHIELD | Attending: General Surgery | Admitting: General Surgery

## 2018-08-25 DIAGNOSIS — Z79899 Other long term (current) drug therapy: Secondary | ICD-10-CM | POA: Insufficient documentation

## 2018-08-25 DIAGNOSIS — K620 Anal polyp: Secondary | ICD-10-CM | POA: Diagnosis not present

## 2018-08-25 DIAGNOSIS — Z8249 Family history of ischemic heart disease and other diseases of the circulatory system: Secondary | ICD-10-CM | POA: Diagnosis not present

## 2018-08-25 DIAGNOSIS — K644 Residual hemorrhoidal skin tags: Secondary | ICD-10-CM | POA: Diagnosis not present

## 2018-08-25 DIAGNOSIS — K648 Other hemorrhoids: Secondary | ICD-10-CM | POA: Insufficient documentation

## 2018-08-25 DIAGNOSIS — Z793 Long term (current) use of hormonal contraceptives: Secondary | ICD-10-CM | POA: Diagnosis not present

## 2018-08-25 HISTORY — PX: EVALUATION UNDER ANESTHESIA WITH HEMORRHOIDECTOMY: SHX5624

## 2018-08-25 LAB — POCT PREGNANCY, URINE: Preg Test, Ur: NEGATIVE

## 2018-08-25 SURGERY — EXAM UNDER ANESTHESIA WITH HEMORRHOIDECTOMY
Anesthesia: General | Site: Buttocks

## 2018-08-25 MED ORDER — FAMOTIDINE 20 MG PO TABS
20.0000 mg | ORAL_TABLET | Freq: Once | ORAL | Status: AC
  Administered 2018-08-25: 20 mg via ORAL

## 2018-08-25 MED ORDER — FENTANYL CITRATE (PF) 100 MCG/2ML IJ SOLN: 25.0000 ug | INTRAMUSCULAR | Status: DC | PRN

## 2018-08-25 MED ORDER — ONDANSETRON HCL 4 MG/2ML IJ SOLN
INTRAMUSCULAR | Status: DC | PRN
  Administered 2018-08-25: 4 mg via INTRAVENOUS

## 2018-08-25 MED ORDER — MEPERIDINE HCL 50 MG/ML IJ SOLN
6.2500 mg | INTRAMUSCULAR | Status: DC | PRN
  Administered 2018-08-25: 6.25 mg via INTRAVENOUS

## 2018-08-25 MED ORDER — BUPIVACAINE LIPOSOME 1.3 % IJ SUSP
INTRAMUSCULAR | Status: DC | PRN
  Administered 2018-08-25: 20 mL

## 2018-08-25 MED ORDER — ONDANSETRON HCL 4 MG/2ML IJ SOLN
INTRAMUSCULAR | Status: AC
  Filled 2018-08-25: qty 2

## 2018-08-25 MED ORDER — EPINEPHRINE PF 1 MG/ML IJ SOLN
INTRAMUSCULAR | Status: AC
  Filled 2018-08-25: qty 1

## 2018-08-25 MED ORDER — OXYCODONE HCL 5 MG PO TABS: 5.0000 mg | ORAL_TABLET | Freq: Once | ORAL | Status: DC | PRN

## 2018-08-25 MED ORDER — LIDOCAINE HCL (CARDIAC) PF 100 MG/5ML IV SOSY: PREFILLED_SYRINGE | INTRAVENOUS | Status: DC | PRN

## 2018-08-25 MED ORDER — PROMETHAZINE HCL 25 MG/ML IJ SOLN
6.2500 mg | INTRAMUSCULAR | Status: DC | PRN
  Administered 2018-08-25: 6.25 mg via INTRAVENOUS

## 2018-08-25 MED ORDER — BUPIVACAINE HCL (PF) 0.5 % IJ SOLN
INTRAMUSCULAR | Status: AC
  Filled 2018-08-25: qty 30

## 2018-08-25 MED ORDER — SEVOFLURANE IN SOLN
RESPIRATORY_TRACT | Status: AC
  Filled 2018-08-25: qty 250

## 2018-08-25 MED ORDER — FENTANYL CITRATE (PF) 100 MCG/2ML IJ SOLN
INTRAMUSCULAR | Status: DC | PRN
  Administered 2018-08-25 (×2): 50 ug via INTRAVENOUS

## 2018-08-25 MED ORDER — LIDOCAINE (ANORECTAL) 5 % EX CREA: TOPICAL_CREAM | CUTANEOUS | 0 refills | Status: DC

## 2018-08-25 MED ORDER — SODIUM CHLORIDE (PF) 0.9 % IJ SOLN
INTRAMUSCULAR | Status: AC
  Filled 2018-08-25: qty 50

## 2018-08-25 MED ORDER — LIDOCAINE HCL (CARDIAC) PF 100 MG/5ML IV SOSY
PREFILLED_SYRINGE | INTRAVENOUS | Status: DC | PRN
  Administered 2018-08-25: 80 mg via INTRAVENOUS

## 2018-08-25 MED ORDER — LIDOCAINE HCL (PF) 2 % IJ SOLN
INTRAMUSCULAR | Status: AC
  Filled 2018-08-25: qty 10

## 2018-08-25 MED ORDER — ROCURONIUM BROMIDE 50 MG/5ML IV SOLN
INTRAVENOUS | Status: AC
  Filled 2018-08-25: qty 1

## 2018-08-25 MED ORDER — BUPIVACAINE-EPINEPHRINE 0.5% -1:200000 IJ SOLN
INTRAMUSCULAR | Status: DC | PRN
  Administered 2018-08-25: 30 mL

## 2018-08-25 MED ORDER — CIPROFLOXACIN IN D5W 400 MG/200ML IV SOLN
INTRAVENOUS | Status: AC
  Filled 2018-08-25: qty 200

## 2018-08-25 MED ORDER — FAMOTIDINE 20 MG PO TABS
ORAL_TABLET | ORAL | Status: AC
  Administered 2018-08-25: 20 mg via ORAL
  Filled 2018-08-25: qty 1

## 2018-08-25 MED ORDER — MEPERIDINE HCL 50 MG/ML IJ SOLN
INTRAMUSCULAR | Status: AC
  Filled 2018-08-25: qty 1

## 2018-08-25 MED ORDER — ROCURONIUM BROMIDE 100 MG/10ML IV SOLN
INTRAVENOUS | Status: DC | PRN
  Administered 2018-08-25: 40 mg via INTRAVENOUS

## 2018-08-25 MED ORDER — PROMETHAZINE HCL 25 MG/ML IJ SOLN
INTRAMUSCULAR | Status: AC
  Administered 2018-08-25: 6.25 mg via INTRAVENOUS
  Filled 2018-08-25: qty 1

## 2018-08-25 MED ORDER — CELECOXIB 100 MG PO CAPS: 100.0000 mg | ORAL_CAPSULE | Freq: Two times a day (BID) | ORAL | 0 refills | Status: AC

## 2018-08-25 MED ORDER — PROPOFOL 10 MG/ML IV BOLUS
INTRAVENOUS | Status: AC
  Filled 2018-08-25: qty 20

## 2018-08-25 MED ORDER — OXYCODONE HCL 5 MG/5ML PO SOLN: 5.0000 mg | Freq: Once | ORAL | Status: DC | PRN

## 2018-08-25 MED ORDER — ACETAMINOPHEN 10 MG/ML IV SOLN
INTRAVENOUS | Status: DC | PRN
  Administered 2018-08-25: 1000 mg via INTRAVENOUS

## 2018-08-25 MED ORDER — GABAPENTIN 300 MG PO CAPS: 300.0000 mg | ORAL_CAPSULE | Freq: Three times a day (TID) | ORAL | 0 refills | Status: DC

## 2018-08-25 MED ORDER — FENTANYL CITRATE (PF) 100 MCG/2ML IJ SOLN
INTRAMUSCULAR | Status: AC
  Filled 2018-08-25: qty 2

## 2018-08-25 MED ORDER — HYDROCODONE-ACETAMINOPHEN 5-325 MG PO TABS: 1.0000 | ORAL_TABLET | ORAL | 0 refills | Status: AC | PRN

## 2018-08-25 MED ORDER — MIDAZOLAM HCL 2 MG/2ML IJ SOLN
INTRAMUSCULAR | Status: DC | PRN
  Administered 2018-08-25: 2 mg via INTRAVENOUS

## 2018-08-25 MED ORDER — SUGAMMADEX SODIUM 200 MG/2ML IV SOLN
INTRAVENOUS | Status: DC | PRN
  Administered 2018-08-25: 145.2 mg via INTRAVENOUS

## 2018-08-25 MED ORDER — PROPOFOL 10 MG/ML IV BOLUS
INTRAVENOUS | Status: DC | PRN
  Administered 2018-08-25: 120 mg via INTRAVENOUS

## 2018-08-25 MED ORDER — SUCCINYLCHOLINE CHLORIDE 20 MG/ML IJ SOLN
INTRAMUSCULAR | Status: AC
  Filled 2018-08-25: qty 1

## 2018-08-25 MED ORDER — LACTATED RINGERS IV SOLN
INTRAVENOUS | Status: DC
  Administered 2018-08-25 (×2): via INTRAVENOUS

## 2018-08-25 MED ORDER — DEXAMETHASONE SODIUM PHOSPHATE 10 MG/ML IJ SOLN
INTRAMUSCULAR | Status: AC
  Filled 2018-08-25: qty 1

## 2018-08-25 MED ORDER — ACETAMINOPHEN 10 MG/ML IV SOLN
INTRAVENOUS | Status: AC
  Filled 2018-08-25: qty 100

## 2018-08-25 MED ORDER — BUPIVACAINE LIPOSOME 1.3 % IJ SUSP
INTRAMUSCULAR | Status: AC
  Filled 2018-08-25: qty 20

## 2018-08-25 MED ORDER — SODIUM CHLORIDE FLUSH 0.9 % IV SOLN
INTRAVENOUS | Status: AC
  Filled 2018-08-25: qty 10

## 2018-08-25 MED ORDER — MIDAZOLAM HCL 2 MG/2ML IJ SOLN
INTRAMUSCULAR | Status: AC
  Filled 2018-08-25: qty 2

## 2018-08-25 SURGICAL SUPPLY — 34 items
BLADE SURG 15 STRL LF DISP TIS (BLADE) ×1 IMPLANT
BLADE SURG 15 STRL SS (BLADE) ×1
CANISTER SUCT 1200ML W/VALVE (MISCELLANEOUS) ×2 IMPLANT
COVER WAND RF STERILE (DRAPES) ×2 IMPLANT
DRAPE LAPAROTOMY 100X77 ABD (DRAPES) ×2 IMPLANT
DRAPE LEGGINS SURG 28X43 STRL (DRAPES) ×2 IMPLANT
DRAPE UNDER BUTTOCK W/FLU (DRAPES) ×2 IMPLANT
ELECT REM PT RETURN 9FT ADLT (ELECTROSURGICAL) ×2
ELECTRODE REM PT RTRN 9FT ADLT (ELECTROSURGICAL) ×1 IMPLANT
GAUZE SPONGE 4X4 12PLY STRL (GAUZE/BANDAGES/DRESSINGS) ×2 IMPLANT
GLOVE BIO SURGEON STRL SZ 6.5 (GLOVE) ×4 IMPLANT
GOWN STRL REUS W/ TWL LRG LVL3 (GOWN DISPOSABLE) ×2 IMPLANT
GOWN STRL REUS W/TWL LRG LVL3 (GOWN DISPOSABLE) ×2
HEMOSTAT SURGICEL 2X3 (HEMOSTASIS) ×2 IMPLANT
LABEL OR SOLS (LABEL) ×2 IMPLANT
NEEDLE HYPO 25X1 1.5 SAFETY (NEEDLE) ×2 IMPLANT
NS IRRIG 500ML POUR BTL (IV SOLUTION) ×2 IMPLANT
PACK BASIN MINOR ARMC (MISCELLANEOUS) ×2 IMPLANT
PAD ABD DERMACEA PRESS 5X9 (GAUZE/BANDAGES/DRESSINGS) ×2 IMPLANT
PAD PREP 24X41 OB/GYN DISP (PERSONAL CARE ITEMS) ×2 IMPLANT
SHEARS HARMONIC 9CM CVD (BLADE) ×2 IMPLANT
SOL PREP PVP 2OZ (MISCELLANEOUS) ×2
SOLUTION PREP PVP 2OZ (MISCELLANEOUS) ×1 IMPLANT
STAPLER PROXIMATE HCS (STAPLE) IMPLANT
STRAP SAFETY 5IN WIDE (MISCELLANEOUS) ×2 IMPLANT
SURGILUBE 2OZ TUBE FLIPTOP (MISCELLANEOUS) ×2 IMPLANT
SUT ETHILON 3-0 FS-10 30 BLK (SUTURE) ×2
SUT VIC AB 2-0 SH 27 (SUTURE) ×1
SUT VIC AB 2-0 SH 27XBRD (SUTURE) ×1 IMPLANT
SUT VIC AB 3-0 SH 27 (SUTURE) ×2
SUT VIC AB 3-0 SH 27X BRD (SUTURE) ×2 IMPLANT
SUTURE EHLN 3-0 FS-10 30 BLK (SUTURE) ×1 IMPLANT
SYR 10ML LL (SYRINGE) ×2 IMPLANT
SYR BULB IRRIG 60ML STRL (SYRINGE) ×2 IMPLANT

## 2018-08-25 NOTE — Transfer of Care (Signed)
Immediate Anesthesia Transfer of Care Note  Patient: Barbara Schmidt  Procedure(s) Performed: Francia Greaves UNDER ANESTHESIA WITH INTERNAL/EXTERNAL HEMORRHOIDECTOMY (N/A Buttocks)  Patient Location: PACU  Anesthesia Type:General  Level of Consciousness: awake and alert   Airway & Oxygen Therapy: Patient Spontanous Breathing and Patient connected to face mask oxygen  Post-op Assessment: Report given to RN and Post -op Vital signs reviewed and stable  Post vital signs: Reviewed and stable  Last Vitals:  Vitals Value Taken Time  BP 129/93 08/25/2018 10:46 AM  Temp    Pulse 113 08/25/2018 10:48 AM  Resp 30 08/25/2018 10:48 AM  SpO2 100 % 08/25/2018 10:48 AM  Vitals shown include unvalidated device data.  Last Pain:  Vitals:   08/25/18 0812  TempSrc: Temporal  PainSc: 6          Complications: No apparent anesthesia complications

## 2018-08-25 NOTE — Anesthesia Post-op Follow-up Note (Signed)
Anesthesia QCDR form completed.        

## 2018-08-25 NOTE — Anesthesia Postprocedure Evaluation (Signed)
Anesthesia Post Note  Patient: Barbara Schmidt  Procedure(s) Performed: EXAM UNDER ANESTHESIA WITH INTERNAL/EXTERNAL HEMORRHOIDECTOMY (N/A Buttocks)  Patient location during evaluation: PACU Anesthesia Type: General Level of consciousness: awake and alert and oriented Pain management: pain level controlled Vital Signs Assessment: post-procedure vital signs reviewed and stable Respiratory status: spontaneous breathing, nonlabored ventilation and respiratory function stable Cardiovascular status: blood pressure returned to baseline and stable Postop Assessment: no signs of nausea or vomiting Anesthetic complications: no     Last Vitals:  Vitals:   08/25/18 1115 08/25/18 1145  BP: 133/82 111/74  Pulse: 96 91  Resp: 18 18  Temp: 37.4 C (!) 36.3 C  SpO2: 100% 100%    Last Pain:  Vitals:   08/25/18 1145  TempSrc: Temporal  PainSc: 0-No pain                 Laverne Klugh

## 2018-08-25 NOTE — Interval H&P Note (Signed)
History and Physical Interval Note:  08/25/2018 8:55 AM  Barbara Schmidt  has presented today for surgery, with the diagnosis of INTERNAL AND EXTERNAL BLEEDING HEMORRHOIDS  The various methods of treatment have been discussed with the patient and family. After consideration of risks, benefits and other options for treatment, the patient has consented to  Procedure(s): EXAM UNDER ANESTHESIA WITH INTERNAL/EXTERNAL HEMORRHOIDECTOMY (N/A) as a surgical intervention .  The patient's history has been reviewed, patient examined, no change in status, stable for surgery.  I have reviewed the patient's chart and labs.  Questions were answered to the patient's satisfaction.     Carolan Shiver

## 2018-08-25 NOTE — Discharge Instructions (Signed)
Diet: Resume home heart healthy regular diet.   Activity: Increase activity level as tolerated, light activity and walking are encouraged. Do not drive or drink alcohol if taking narcotic pain medications.  Wound care: May shower with soapy water and pat dry (do not rub incisions), but no baths or submerging incision underwater until follow-up. (no swimming)   Medications: Resume all home medications. For mild to moderate pain: acetaminophen (Tylenol) or ibuprofen (if no kidney disease). Combining Tylenol with alcohol can substantially increase your risk of causing liver disease. Narcotic pain medications, if prescribed, can be used for severe pain, though may cause nausea, constipation, and drowsiness. Do not combine Tylenol and Norco within a 6 hour period as Norco contains Tylenol. If you do not need the narcotic pain medication, you do not need to fill the prescription.  Take Gabapentin and Celebrex as prescribed. Take Norco for breakthrough pain.   Take Miralax as needed for constipation.   Call office [phone]) at any time if any questions, worsening pain, fevers/chills, bleeding, drainage from incision site, or other concerns.   Fiber Content in Foods  See the following list for the dietary fiber content of some common foods. High-fiber foods High-fiber foods contain 4 grams or more (4g or more) of fiber per serving. They include:  Artichoke (fresh) -- 1 medium has 10.3g of fiber.  Baked beans, plain or vegetarian (canned) --  cup has 5.2g of fiber.  Blackberries or raspberries (fresh) --  cup has 4g of fiber.  Bran cereal --  cup has 8.6g of fiber.  Bulgur (cooked) --  cup has 4g of fiber.  Kidney beans (canned) --  cup has 6.8g of fiber.  Lentils (cooked) --  cup has 7.8g of fiber.  Pear (fresh) -- 1 medium has 5.1g of fiber.  Peas (frozen) --  cup has 4.4g of fiber.  Pinto beans (canned) --  cup has 5.5g of fiber.  Pinto beans (dried and cooked) --   cup has 7.7g of fiber.  Potato with skin (baked) -- 1 medium has 4.4g of fiber.  Quinoa (cooked) --  cup has 5g of fiber.  Soybeans (canned, frozen, or fresh) --  cup has 5.1g of fiber. Moderate-fiber foods Moderate-fiber foods contain 1-4 grams (1-4g) of fiber per serving. They include:  Almonds -- 1 oz. has 3.5g of fiber.  Apple with skin -- 1 medium has 3.3g of fiber.  Applesauce, sweetened --  cup has 1.5g of fiber.  Bagel, plain -- one 4-inch (10-cm) bagel has 2g of fiber.  Banana -- 1 medium has 3.1g of fiber.  Broccoli (cooked) --  cup has 2.5g of fiber.  Carrots (cooked) --  cup has 2.3g of fiber.  Corn (canned or frozen) --  cup has 2.1g of fiber.  Corn tortilla -- one 6-inch (15-cm) tortilla has 1.5g of fiber.  Green beans (canned) --  cup has 2g of fiber.  Instant oatmeal --  cup has about 2g of fiber.  Long-grain brown rice (cooked) -- 1 cup has 3.5g of fiber.  Macaroni, enriched (cooked) -- 1 cup has 2.5g of fiber.  Melon -- 1 cup has 1.4g of fiber.  Multigrain cereal --  cup has about 2-4g of fiber.  Orange -- 1 small has 3.1g of fiber.  Potatoes, mashed --  cup has 1.6g of fiber.  Raisins -- 1/4 cup has 1.6g of fiber.  Squash --  cup has 2.9g of fiber.  Sunflower seeds --  cup has 1.1g of fiber.  Tomato -- 1 medium has 1.5g of fiber.  Vegetable or soy patty -- 1 has 3.4g of fiber.  Whole-wheat bread -- 1 slice has 2g of fiber.  Whole-wheat spaghetti --  cup has 3.2g of fiber. Low-fiber foods Low-fiber foods contain less than 1 gram (less than 1g) of fiber per serving. They include:  Egg -- 1 large.  Flour tortilla -- one 6-inch (15-cm) tortilla.  Fruit juice --  cup.  Lettuce -- 1 cup.  Meat, poultry, or fish -- 1 oz.  Milk -- 1 cup.  Spinach (raw) -- 1 cup.  White bread -- 1 slice.  White rice --  cup.  Yogurt --  cup. Actual amounts of fiber in foods may be different depending on processing. Talk with your  dietitian about how much fiber you need in your diet. This information is not intended to replace advice given to you by your health care provider. Make sure you discuss any questions you have with your health care provider. Document Released: 11/28/2006 Document Revised: 12/18/2015 Document Reviewed: 09/04/2015 Elsevier Interactive Patient Education  2019 Elsevier Inc.   AMBULATORY SURGERY  DISCHARGE INSTRUCTIONS   1) The drugs that you were given will stay in your system until tomorrow so for the next 24 hours you should not:  A) Drive an automobile B) Make any legal decisions C) Drink any alcoholic beverage   2) You may resume regular meals tomorrow.  Today it is better to start with liquids and gradually work up to solid foods.  You may eat anything you prefer, but it is better to start with liquids, then soup and crackers, and gradually work up to solid foods.   3) Please notify your doctor immediately if you have any unusual bleeding, trouble breathing, redness and pain at the surgery site, drainage, fever, or pain not relieved by medication.    4) Additional Instructions:        Please contact your physician with any problems or Same Day Surgery at [phone], Monday through Friday 6 am to 4 pm, or [place] [facility] Main number at [phone].

## 2018-08-25 NOTE — Anesthesia Procedure Notes (Signed)
Procedure Name: Intubation Date/Time: 08/25/2018 9:25 AM Performed by: Allean Found, CRNA Pre-anesthesia Checklist: Patient identified, Patient being monitored, Timeout performed, Emergency Drugs available and Suction available Patient Re-evaluated:Patient Re-evaluated prior to induction Oxygen Delivery Method: Circle system utilized Preoxygenation: Pre-oxygenation with 100% oxygen Induction Type: IV induction Ventilation: Mask ventilation without difficulty Laryngoscope Size: Mac and 3 Grade View: Grade I Tube type: Oral Tube size: 7.0 mm Number of attempts: 1 Airway Equipment and Method: Stylet Placement Confirmation: ETT inserted through vocal cords under direct vision,  positive ETCO2 and breath sounds checked- equal and bilateral Secured at: 21 cm Tube secured with: Tape Dental Injury: Teeth and Oropharynx as per pre-operative assessment

## 2018-08-25 NOTE — Progress Notes (Signed)
On arrival to PACU patient was noted with uncontrollable shivering .  Demerol given per order via IV.  Will continue to monitor patient.

## 2018-08-25 NOTE — Anesthesia Preprocedure Evaluation (Signed)
Anesthesia Evaluation  Patient identified by MRN, date of birth, ID band Patient awake    Reviewed: Allergy & Precautions, NPO status , Patient's Chart, lab work & pertinent test results  History of Anesthesia Complications Negative for: history of anesthetic complications  Airway Mallampati: II  TM Distance: >3 FB Neck ROM: Full    Dental no notable dental hx.    Pulmonary neg pulmonary ROS, neg sleep apnea, neg COPD,    breath sounds clear to auscultation- rhonchi (-) wheezing      Cardiovascular Exercise Tolerance: Good (-) hypertension(-) CAD, (-) Past MI, (-) Cardiac Stents and (-) CABG  Rhythm:Regular Rate:Normal - Systolic murmurs and - Diastolic murmurs    Neuro/Psych  Headaches, negative psych ROS   GI/Hepatic negative GI ROS, Neg liver ROS,   Endo/Other  negative endocrine ROSneg diabetes  Renal/GU negative Renal ROS     Musculoskeletal negative musculoskeletal ROS (+)   Abdominal (+) - obese,   Peds  Hematology  (+) anemia ,   Anesthesia Other Findings Past Medical History: No date: Allergy No date: Anemia No date: Antithrombin III deficiency (HCC)     Comment:  during pregnancy in 2018 No date: Complication of anesthesia     Comment:  MTHFR mutation during pregnancy in 2018 12/17/2015: H/O pre-term labor     Comment:  Cervical incompetence, placental insufficiency and               umbilical cord insufficiency  No date: Headache No date: High-risk pregnancy No date: Miscarriage     Comment:  2 No date: MTHFR deficiency complicating pregnancy (HCC)     Comment:  in 2018 No date: Vaginal delivery   Reproductive/Obstetrics                             Anesthesia Physical Anesthesia Plan  ASA: II  Anesthesia Plan: General   Post-op Pain Management:    Induction: Intravenous  PONV Risk Score and Plan: 2 and Ondansetron, Dexamethasone and Midazolam  Airway  Management Planned: LMA  Additional Equipment:   Intra-op Plan:   Post-operative Plan:   Informed Consent: I have reviewed the patients History and Physical, chart, labs and discussed the procedure including the risks, benefits and alternatives for the proposed anesthesia with the patient or authorized representative who has indicated his/her understanding and acceptance.     Dental advisory given  Plan Discussed with: CRNA and Anesthesiologist  Anesthesia Plan Comments:         Anesthesia Quick Evaluation

## 2018-08-25 NOTE — Op Note (Signed)
Preoperative diagnosis: Internal and external hemorrhoids with bleeding   Postoperative diagnosis:  Internal and external hemorrhoids with bleeding   Procedure: Rectal exam under anesthesia,  Internal and external hemorrhoidectomy (2 columns).  Surgeon: Dr. Hazle Quant  Anesthesia: General  Wound classification: Clean Contaminated  Indications: Patient is a 38 y.o. female was found to have symptomatic hemorrhoids refractory to medical managemen.   Findings: 1. Enlarged internal and external hemorrhoids of the right posterior and left lateral columns. 2. Internal and external anal sphincter identified and preserved 3. No fissures of fistula opening identified 4. No masses or lesion identified on anal canal.  5. Adequate hemostasis  Description of procedure: The patient was brought to the operating room and general anesthesia was induced. Patient was placed in the prone jackknife position. A time-out was completed verifying correct patient, procedure, site, positioning, and implant(s) and/or special equipment prior to beginning this procedure. The buttocks were taped apart.  The perineum was prepped and draped in standard sterile fashion. Local anesthetic was injected as a perianal block. An anoscope was introduced and the three hemorrhoidal pedicles were identified. The right anterior column was not enlarged and was not resected. A Kelly clamp was placed near the base of the two enlarged pedicles near the dentate line and retracted externally to exteriorize the hemorrhoidal pedicles.  Each pedicle was excised in turn in the following fashion. An elliptical incision was made extending from perianal skin to anorectal ring including both internal and external hemorrhoids and excising a minimum amount of anoderm. Flaps were developed on both aspects of the incision, taking care to elevate only skin and mucosa. The dilated venous mass was dissected using Metzenbaum scissors from the underlying  sphincter muscle. The base was ligated with a 2-0 Vicryl figure of 8 suture. The pedicle was amputated from the base. Hemostasis was achieved using electrocautery. Following hemostasis, the skin and mucosal incisions were closed with a running lock stitch of 3-0 Vicryl on the mucosal aspect and converted to subcuticular once skin was encountered. The anal canal was then injected with local anesthetic. A gauze pad was tucked between the gluteal folds.  The patient tolerated the procedure well and was taken to the postanesthesia care unit in stable condition.   Specimen: Right posterior hemorrhoid                     Left lateral hemorrhoid  Complications: none  EBL: 5 mL

## 2018-08-28 LAB — SURGICAL PATHOLOGY

## 2018-09-20 ENCOUNTER — Telehealth: Payer: Self-pay

## 2018-09-20 ENCOUNTER — Other Ambulatory Visit: Payer: Self-pay | Admitting: Internal Medicine

## 2018-09-20 DIAGNOSIS — B373 Candidiasis of vulva and vagina: Secondary | ICD-10-CM

## 2018-09-20 DIAGNOSIS — B3731 Acute candidiasis of vulva and vagina: Secondary | ICD-10-CM

## 2018-09-20 MED ORDER — FLUCONAZOLE 100 MG PO TABS: 100.0000 mg | ORAL_TABLET | Freq: Once | ORAL | 0 refills | Status: AC

## 2018-09-20 NOTE — Telephone Encounter (Signed)
Requests Diflucan for yeast infection. [facility] [place]

## 2018-10-13 ENCOUNTER — Encounter: Payer: Self-pay | Admitting: Internal Medicine

## 2018-10-13 ENCOUNTER — Ambulatory Visit (INDEPENDENT_AMBULATORY_CARE_PROVIDER_SITE_OTHER): Payer: BLUE CROSS/BLUE SHIELD | Admitting: Internal Medicine

## 2018-10-13 ENCOUNTER — Other Ambulatory Visit: Payer: Self-pay

## 2018-10-13 VITALS — BP 132/100 | HR 67 | Ht 66.0 in | Wt 169.0 lb

## 2018-10-13 DIAGNOSIS — S8990XA Unspecified injury of unspecified lower leg, initial encounter: Secondary | ICD-10-CM | POA: Diagnosis not present

## 2018-10-13 NOTE — Progress Notes (Signed)
Date:  10/13/2018   Name:  Barbara Schmidt   DOB:  [birth year]   MRN:  [id number]   Chief Complaint: Knee Pain (X 1 month. Plays basketball and feels like she felt something pop twice. It is painful. Knee cap moves very easily. )  Knee Pain   The incident occurred at the gym. Injury mechanism: injured knee playing basketball - she thinks she planted and maybe twisted. The pain is present in the left knee. The quality of the pain is described as aching. The pain is moderate. The pain has been constant since onset. The symptoms are aggravated by weight bearing. She has tried immobilization, ice and NSAIDs for the symptoms. The treatment provided mild relief.    Review of Systems  Constitutional: Negative for chills and fatigue.  Respiratory: Negative for choking, shortness of breath and wheezing.   Cardiovascular: Negative for chest pain, palpitations and leg swelling.  Musculoskeletal: Positive for arthralgias, gait problem and joint swelling.    Patient Active Problem List   Diagnosis Date Noted  . 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 Sebaceous cyst 07/27/2016  . Blood pressure elevated without history of HTN 01/13/2016  . Environmental and seasonal allergies 12/17/2015    No Known Allergies  Past Surgical History:  Procedure Laterality Date  . ARTHROSCOPIC REPAIR ACL Right    high school   . CESAREAN SECTION  09/2015   1 vaginal and 1 c section  . EVALUATION UNDER ANESTHESIA WITH HEMORRHOIDECTOMY N/A 08/25/2018   Procedure: EXAM UNDER ANESTHESIA WITH INTERNAL/EXTERNAL HEMORRHOIDECTOMY;  Surgeon: Carolan Shiver, MD;  Location: [facility] ORS;  Service: General;  Laterality: N/A;    Social History   Tobacco Use  . Smoking status: Never Smoker  . Smokeless tobacco: Never Used  Substance Use Topics  . Alcohol use: No  . Drug use: No     Medication  list has been reviewed and updated.  Current Meds  Medication Sig  . acyclovir ointment (ZOVIRAX) 5 % APPLY 1 APPLICATION TOPICALLY EVERY 3 (THREE) HOURS.  Marland Kitchen aspirin-acetaminophen-caffeine (EXCEDRIN MIGRAINE) 250-250-65 MG tablet Take 2 tablets by mouth 3 (three) times daily as needed for headache.  . drospirenone-ethinyl estradiol (NIKKI) 3-0.02 MG tablet Take 1 tablet by mouth daily.  Marland Kitchen ibuprofen (ADVIL,MOTRIN) 200 MG tablet Take 400 mg by mouth every 8 (eight) hours as needed (for pain.).  Marland Kitchen Lidocaine, Anorectal, 5 % CREA Apply to peri anal area twice a day as needed.    PHQ 2/9 Scores 10/13/2018 01/16/2018 12/17/2015  PHQ - 2 Score 0 0 0  PHQ- 9 Score - 0 -    Physical Exam Vitals signs and nursing note reviewed.  Constitutional:      General: She is not in acute distress.    Appearance: She is well-developed.  HENT:     Head: Normocephalic and atraumatic.  Pulmonary:     Effort: Pulmonary effort is normal. No respiratory distress.  Musculoskeletal: Normal range of motion.     Left knee: She exhibits effusion. She exhibits normal range of motion, no swelling, no ecchymosis and normal patellar mobility. Tenderness found. Medial joint line and lateral joint line tenderness noted.     Comments: Negative anterior drawer sign  Skin:    General: Skin is warm and dry.     Findings: No rash.  Neurological:     Mental Status: She is alert and oriented to person,  place, and time.  Psychiatric:        Attention and Perception: Attention normal.        Mood and Affect: Mood normal.        Behavior: Behavior normal.        Thought Content: Thought content normal.     Wt Readings from Last 3 Encounters:  10/13/18 169 lb (76.7 kg)  08/25/18 160 lb (72.6 kg)  07/28/18 165 lb (74.8 kg)    BP (!) 132/100   Pulse 67   Ht 5\' 6"  (1.676 m)   Wt 169 lb (76.7 kg)   SpO2 99%   BMI 27.28 kg/m   Assessment and Plan: 1. Knee injury, initial encounter Continue conservative care Advil,  elevation and ice - Ambulatory referral to Orthopedic Surgery   Partially dictated using Dragon software. Any errors are unintentional.  Bari Edward, MD [facility]  10/13/2018

## 2018-10-17 DIAGNOSIS — L700 Acne vulgaris: Secondary | ICD-10-CM | POA: Diagnosis not present

## 2018-10-20 DIAGNOSIS — M1712 Unilateral primary osteoarthritis, left knee: Secondary | ICD-10-CM | POA: Diagnosis not present

## 2018-10-20 DIAGNOSIS — S83002A Unspecified subluxation of left patella, initial encounter: Secondary | ICD-10-CM | POA: Diagnosis not present

## 2018-10-20 DIAGNOSIS — M2392 Unspecified internal derangement of left knee: Secondary | ICD-10-CM | POA: Diagnosis not present

## 2018-10-20 DIAGNOSIS — M25562 Pain in left knee: Secondary | ICD-10-CM | POA: Diagnosis not present

## 2018-11-10 ENCOUNTER — Telehealth: Payer: Self-pay

## 2018-11-10 NOTE — Telephone Encounter (Signed)
She has been going between me and GYN for diflucan and yet only had one wet prep in December by GYN which did not show yeast.  She needs an evaluation.

## 2018-11-10 NOTE — Telephone Encounter (Signed)
Patient has yeast inf. OTC not helpful wants RX sent to [facility]-itching and irritated. No Dysuria but did have procedure week ago to remove hemorrhoid. Feels this is unrelated. No fever.

## 2018-11-10 NOTE — Telephone Encounter (Signed)
Left message to call and schedule OV

## 2018-11-10 NOTE — Telephone Encounter (Signed)
Ov Monday

## 2018-11-13 ENCOUNTER — Other Ambulatory Visit: Payer: Self-pay

## 2018-11-13 ENCOUNTER — Other Ambulatory Visit: Payer: Self-pay | Admitting: Orthopedic Surgery

## 2018-11-13 ENCOUNTER — Ambulatory Visit (INDEPENDENT_AMBULATORY_CARE_PROVIDER_SITE_OTHER): Payer: BLUE CROSS/BLUE SHIELD | Admitting: Internal Medicine

## 2018-11-13 ENCOUNTER — Encounter: Payer: Self-pay | Admitting: Internal Medicine

## 2018-11-13 VITALS — BP 114/77 | HR 74 | Resp 16 | Ht 66.0 in | Wt 164.0 lb

## 2018-11-13 DIAGNOSIS — M2392 Unspecified internal derangement of left knee: Secondary | ICD-10-CM

## 2018-11-13 DIAGNOSIS — M6281 Muscle weakness (generalized): Secondary | ICD-10-CM | POA: Diagnosis not present

## 2018-11-13 DIAGNOSIS — M25562 Pain in left knee: Secondary | ICD-10-CM | POA: Diagnosis not present

## 2018-11-13 DIAGNOSIS — M25362 Other instability, left knee: Secondary | ICD-10-CM

## 2018-11-13 DIAGNOSIS — N761 Subacute and chronic vaginitis: Secondary | ICD-10-CM | POA: Diagnosis not present

## 2018-11-13 DIAGNOSIS — M1712 Unilateral primary osteoarthritis, left knee: Secondary | ICD-10-CM | POA: Diagnosis not present

## 2018-11-13 DIAGNOSIS — G8929 Other chronic pain: Secondary | ICD-10-CM

## 2018-11-13 LAB — POCT WET PREP WITH KOH
KOH Prep POC: NEGATIVE
RBC Wet Prep HPF POC: 0
Trichomonas, UA: NEGATIVE

## 2018-11-13 MED ORDER — METRONIDAZOLE 500 MG PO TABS: 500.0000 mg | ORAL_TABLET | Freq: Two times a day (BID) | ORAL | 0 refills | Status: AC

## 2018-11-13 MED ORDER — FLUCONAZOLE 100 MG PO TABS: 100.0000 mg | ORAL_TABLET | Freq: Once | ORAL | 0 refills | Status: AC

## 2018-11-13 NOTE — Progress Notes (Signed)
Date:  11/13/2018   Name:  Barbara Schmidt   DOB:  [birth year]   MRN:  [id number]   Chief Complaint: Vaginitis (x 1 week of itching and moisture. )  Vaginal Itching  The patient's primary symptoms include genital itching and vaginal discharge. The patient's pertinent negatives include no genital odor, genital rash or pelvic pain. This is a recurrent problem. The current episode started in the past 7 days. The problem occurs constantly. The problem has been gradually worsening. The patient is experiencing no pain. She is not pregnant. Associated symptoms include painful intercourse. Pertinent negatives include no chills, diarrhea, frequency or rash. The vaginal discharge was white and copious. There has been no bleeding. Treatments tried: over the past 4 months, she has be treated with diflucan 3 times presumptively. She is sexually active. No, her partner does not have an STD.    Review of Systems  Constitutional: Negative for chills.  Respiratory: Negative for cough, chest tightness, shortness of breath and wheezing.   Cardiovascular: Negative for chest pain, palpitations and leg swelling.  Gastrointestinal: Negative for diarrhea.  Genitourinary: Positive for dyspareunia and vaginal discharge. Negative for frequency and pelvic pain.  Skin: Negative for color change and rash.  Psychiatric/Behavioral: Negative for dysphoric mood and sleep disturbance.    Patient Active Problem List   Diagnosis Date Noted  . Knee injury, initial encounter 10/13/2018  . 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 Sebaceous cyst 07/27/2016  . Blood pressure elevated without history of HTN 01/13/2016  . Environmental and seasonal allergies 12/17/2015    No Known Allergies  Past Surgical History:  Procedure Laterality Date  . ARTHROSCOPIC REPAIR ACL Right    high school   . CESAREAN  SECTION  09/2015   1 vaginal and 1 c section  . EVALUATION UNDER ANESTHESIA WITH HEMORRHOIDECTOMY N/A 08/25/2018   Procedure: EXAM UNDER ANESTHESIA WITH INTERNAL/EXTERNAL HEMORRHOIDECTOMY;  Surgeon: Carolan Shiverintron-Diaz, Edgardo, MD;  Location: [facility] ORS;  Service: General;  Laterality: N/A;    Social History   Tobacco Use  . Smoking status: Never Smoker  . Smokeless tobacco: Never Used  Substance Use Topics  . Alcohol use: No  . Drug use: No     Medication list has been reviewed and updated.  Current Meds  Medication Sig  . acyclovir ointment (ZOVIRAX) 5 % APPLY 1 APPLICATION TOPICALLY EVERY 3 (THREE) HOURS.  Marland Kitchen. aspirin-acetaminophen-caffeine (EXCEDRIN MIGRAINE) 250-250-65 MG tablet Take 2 tablets by mouth 3 (three) times daily as needed for headache.  . drospirenone-ethinyl estradiol (NIKKI) 3-0.02 MG tablet Take 1 tablet by mouth daily.  Marland Kitchen. ibuprofen (ADVIL,MOTRIN) 200 MG tablet Take 400 mg by mouth every 8 (eight) hours as needed (for pain.).    PHQ 2/9 Scores 10/13/2018 01/16/2018 12/17/2015  PHQ - 2 Score 0 0 0  PHQ- 9 Score - 0 -    BP Readings from Last 3 Encounters:  11/13/18 114/77  10/13/18 (!) 132/100  08/25/18 116/67    Physical Exam Vitals signs and nursing note reviewed.  Constitutional:      General: She is not in acute distress.    Appearance: She is well-developed.  HENT:     Head: Normocephalic and atraumatic.  Neck:     Musculoskeletal: Normal range of motion and neck supple.  Cardiovascular:     Rate and Rhythm: Normal rate and regular rhythm.  Pulmonary:     Effort: Pulmonary  effort is normal. No respiratory distress.     Breath sounds: Normal breath sounds.  Abdominal:     General: Abdomen is flat.     Palpations: There is no mass.     Tenderness: There is no abdominal tenderness.     Hernia: No hernia is present.  Musculoskeletal: Normal range of motion.  Skin:    General: Skin is warm and dry.     Findings: No rash.  Neurological:     Mental  Status: She is alert and oriented to person, place, and time.  Psychiatric:        Behavior: Behavior normal.        Thought Content: Thought content normal.     Wt Readings from Last 3 Encounters:  11/13/18 164 lb (74.4 kg)  10/13/18 169 lb (76.7 kg)  08/25/18 160 lb (72.6 kg)    BP 114/77   Pulse 74   Resp 16   Ht 5\' 6"  (1.676 m)   Wt 164 lb (74.4 kg)   LMP 10/20/2018   SpO2 98%   BMI 26.47 kg/m   Assessment and Plan: 1. Subacute vaginitis Use diaper rash ointment to protect irritated areas Treat empirically for candida although Wet Prep and KOH did not reveal any fungal elements - fluconazole (DIFLUCAN) 100 MG tablet; Take 1 tablet (100 mg total) by mouth once for 1 dose.  Dispense: 1 tablet; Refill: 0 - metroNIDAZOLE (FLAGYL) 500 MG tablet; Take 1 tablet (500 mg total) by mouth 2 (two) times daily for 7 days.  Dispense: 14 tablet; Refill: 0   Partially dictated using Animal nutritionist. Any errors are unintentional.  Bari Edward, MD [facility]  11/13/2018

## 2018-11-13 NOTE — Patient Instructions (Signed)
Desitin or similar baby diaper rash ointment

## 2018-11-20 DIAGNOSIS — M25562 Pain in left knee: Secondary | ICD-10-CM | POA: Diagnosis not present

## 2018-11-20 DIAGNOSIS — M6281 Muscle weakness (generalized): Secondary | ICD-10-CM | POA: Diagnosis not present

## 2018-11-20 DIAGNOSIS — M25362 Other instability, left knee: Secondary | ICD-10-CM | POA: Diagnosis not present

## 2018-11-20 DIAGNOSIS — M2392 Unspecified internal derangement of left knee: Secondary | ICD-10-CM | POA: Diagnosis not present

## 2018-11-22 DIAGNOSIS — M6281 Muscle weakness (generalized): Secondary | ICD-10-CM | POA: Diagnosis not present

## 2018-11-22 DIAGNOSIS — M25362 Other instability, left knee: Secondary | ICD-10-CM | POA: Diagnosis not present

## 2018-11-22 DIAGNOSIS — M2392 Unspecified internal derangement of left knee: Secondary | ICD-10-CM | POA: Diagnosis not present

## 2018-11-22 DIAGNOSIS — M25562 Pain in left knee: Secondary | ICD-10-CM | POA: Diagnosis not present

## 2018-11-29 DIAGNOSIS — M25562 Pain in left knee: Secondary | ICD-10-CM | POA: Diagnosis not present

## 2018-11-29 DIAGNOSIS — M2392 Unspecified internal derangement of left knee: Secondary | ICD-10-CM | POA: Diagnosis not present

## 2018-11-29 DIAGNOSIS — M6281 Muscle weakness (generalized): Secondary | ICD-10-CM | POA: Diagnosis not present

## 2018-11-29 DIAGNOSIS — M25362 Other instability, left knee: Secondary | ICD-10-CM | POA: Diagnosis not present

## 2018-12-04 DIAGNOSIS — M6281 Muscle weakness (generalized): Secondary | ICD-10-CM | POA: Diagnosis not present

## 2018-12-04 DIAGNOSIS — M25362 Other instability, left knee: Secondary | ICD-10-CM | POA: Diagnosis not present

## 2018-12-04 DIAGNOSIS — M2392 Unspecified internal derangement of left knee: Secondary | ICD-10-CM | POA: Diagnosis not present

## 2018-12-04 DIAGNOSIS — M25562 Pain in left knee: Secondary | ICD-10-CM | POA: Diagnosis not present

## 2018-12-06 DIAGNOSIS — M25562 Pain in left knee: Secondary | ICD-10-CM | POA: Diagnosis not present

## 2018-12-06 DIAGNOSIS — M6281 Muscle weakness (generalized): Secondary | ICD-10-CM | POA: Diagnosis not present

## 2018-12-06 DIAGNOSIS — M2392 Unspecified internal derangement of left knee: Secondary | ICD-10-CM | POA: Diagnosis not present

## 2018-12-06 DIAGNOSIS — M25362 Other instability, left knee: Secondary | ICD-10-CM | POA: Diagnosis not present

## 2018-12-11 DIAGNOSIS — M6281 Muscle weakness (generalized): Secondary | ICD-10-CM | POA: Diagnosis not present

## 2018-12-11 DIAGNOSIS — M25362 Other instability, left knee: Secondary | ICD-10-CM | POA: Diagnosis not present

## 2018-12-11 DIAGNOSIS — M2392 Unspecified internal derangement of left knee: Secondary | ICD-10-CM | POA: Diagnosis not present

## 2018-12-11 DIAGNOSIS — M25562 Pain in left knee: Secondary | ICD-10-CM | POA: Diagnosis not present

## 2018-12-12 ENCOUNTER — Other Ambulatory Visit: Payer: Self-pay

## 2018-12-12 ENCOUNTER — Ambulatory Visit
Admission: RE | Admit: 2018-12-12 | Discharge: 2018-12-12 | Disposition: A | Discharge status: Home / Self Care | Payer: BLUE CROSS/BLUE SHIELD | Source: Ambulatory Visit | Attending: Orthopedic Surgery | Admitting: Orthopedic Surgery

## 2018-12-12 DIAGNOSIS — M25562 Pain in left knee: Secondary | ICD-10-CM | POA: Diagnosis not present

## 2018-12-12 DIAGNOSIS — M25362 Other instability, left knee: Secondary | ICD-10-CM | POA: Diagnosis not present

## 2018-12-12 DIAGNOSIS — M2392 Unspecified internal derangement of left knee: Secondary | ICD-10-CM | POA: Diagnosis not present

## 2018-12-12 DIAGNOSIS — G8929 Other chronic pain: Secondary | ICD-10-CM | POA: Diagnosis not present

## 2018-12-13 DIAGNOSIS — M25562 Pain in left knee: Secondary | ICD-10-CM | POA: Diagnosis not present

## 2018-12-13 DIAGNOSIS — M25362 Other instability, left knee: Secondary | ICD-10-CM | POA: Diagnosis not present

## 2018-12-13 DIAGNOSIS — M6281 Muscle weakness (generalized): Secondary | ICD-10-CM | POA: Diagnosis not present

## 2018-12-13 DIAGNOSIS — M2392 Unspecified internal derangement of left knee: Secondary | ICD-10-CM | POA: Diagnosis not present

## 2018-12-14 DIAGNOSIS — M1712 Unilateral primary osteoarthritis, left knee: Secondary | ICD-10-CM | POA: Diagnosis not present

## 2018-12-14 DIAGNOSIS — S83512D Sprain of anterior cruciate ligament of left knee, subsequent encounter: Secondary | ICD-10-CM | POA: Diagnosis not present

## 2018-12-19 DIAGNOSIS — M25362 Other instability, left knee: Secondary | ICD-10-CM | POA: Diagnosis not present

## 2018-12-19 DIAGNOSIS — M6281 Muscle weakness (generalized): Secondary | ICD-10-CM | POA: Diagnosis not present

## 2018-12-19 DIAGNOSIS — M25562 Pain in left knee: Secondary | ICD-10-CM | POA: Diagnosis not present

## 2018-12-19 DIAGNOSIS — M2392 Unspecified internal derangement of left knee: Secondary | ICD-10-CM | POA: Diagnosis not present

## 2018-12-21 DIAGNOSIS — M2392 Unspecified internal derangement of left knee: Secondary | ICD-10-CM | POA: Diagnosis not present

## 2018-12-21 DIAGNOSIS — M25362 Other instability, left knee: Secondary | ICD-10-CM | POA: Diagnosis not present

## 2018-12-21 DIAGNOSIS — M6281 Muscle weakness (generalized): Secondary | ICD-10-CM | POA: Diagnosis not present

## 2018-12-21 DIAGNOSIS — M25562 Pain in left knee: Secondary | ICD-10-CM | POA: Diagnosis not present

## 2018-12-25 DIAGNOSIS — M2392 Unspecified internal derangement of left knee: Secondary | ICD-10-CM | POA: Diagnosis not present

## 2018-12-25 DIAGNOSIS — M25562 Pain in left knee: Secondary | ICD-10-CM | POA: Diagnosis not present

## 2018-12-25 DIAGNOSIS — M25362 Other instability, left knee: Secondary | ICD-10-CM | POA: Diagnosis not present

## 2018-12-25 DIAGNOSIS — M6281 Muscle weakness (generalized): Secondary | ICD-10-CM | POA: Diagnosis not present

## 2018-12-26 ENCOUNTER — Other Ambulatory Visit: Payer: BLUE CROSS/BLUE SHIELD

## 2018-12-27 DIAGNOSIS — M2392 Unspecified internal derangement of left knee: Secondary | ICD-10-CM | POA: Diagnosis not present

## 2018-12-27 DIAGNOSIS — M25562 Pain in left knee: Secondary | ICD-10-CM | POA: Diagnosis not present

## 2018-12-27 DIAGNOSIS — M25362 Other instability, left knee: Secondary | ICD-10-CM | POA: Diagnosis not present

## 2018-12-27 DIAGNOSIS — M6281 Muscle weakness (generalized): Secondary | ICD-10-CM | POA: Diagnosis not present

## 2019-01-01 DIAGNOSIS — M25362 Other instability, left knee: Secondary | ICD-10-CM | POA: Diagnosis not present

## 2019-01-01 DIAGNOSIS — M25562 Pain in left knee: Secondary | ICD-10-CM | POA: Diagnosis not present

## 2019-01-01 DIAGNOSIS — M2392 Unspecified internal derangement of left knee: Secondary | ICD-10-CM | POA: Diagnosis not present

## 2019-01-01 DIAGNOSIS — M6281 Muscle weakness (generalized): Secondary | ICD-10-CM | POA: Diagnosis not present

## 2019-01-03 DIAGNOSIS — M25362 Other instability, left knee: Secondary | ICD-10-CM | POA: Diagnosis not present

## 2019-01-03 DIAGNOSIS — M6281 Muscle weakness (generalized): Secondary | ICD-10-CM | POA: Diagnosis not present

## 2019-01-03 DIAGNOSIS — M25562 Pain in left knee: Secondary | ICD-10-CM | POA: Diagnosis not present

## 2019-01-03 DIAGNOSIS — M2392 Unspecified internal derangement of left knee: Secondary | ICD-10-CM | POA: Diagnosis not present

## 2019-01-10 DIAGNOSIS — M25562 Pain in left knee: Secondary | ICD-10-CM | POA: Diagnosis not present

## 2019-01-10 DIAGNOSIS — M6281 Muscle weakness (generalized): Secondary | ICD-10-CM | POA: Diagnosis not present

## 2019-01-10 DIAGNOSIS — M25362 Other instability, left knee: Secondary | ICD-10-CM | POA: Diagnosis not present

## 2019-01-10 DIAGNOSIS — M2392 Unspecified internal derangement of left knee: Secondary | ICD-10-CM | POA: Diagnosis not present

## 2019-01-15 DIAGNOSIS — M25362 Other instability, left knee: Secondary | ICD-10-CM | POA: Diagnosis not present

## 2019-01-15 DIAGNOSIS — M6281 Muscle weakness (generalized): Secondary | ICD-10-CM | POA: Diagnosis not present

## 2019-01-15 DIAGNOSIS — M25562 Pain in left knee: Secondary | ICD-10-CM | POA: Diagnosis not present

## 2019-01-15 DIAGNOSIS — M2392 Unspecified internal derangement of left knee: Secondary | ICD-10-CM | POA: Diagnosis not present

## 2019-01-17 DIAGNOSIS — M2392 Unspecified internal derangement of left knee: Secondary | ICD-10-CM | POA: Diagnosis not present

## 2019-01-17 DIAGNOSIS — M25562 Pain in left knee: Secondary | ICD-10-CM | POA: Diagnosis not present

## 2019-01-17 DIAGNOSIS — M25362 Other instability, left knee: Secondary | ICD-10-CM | POA: Diagnosis not present

## 2019-01-17 DIAGNOSIS — M6281 Muscle weakness (generalized): Secondary | ICD-10-CM | POA: Diagnosis not present

## 2019-01-22 DIAGNOSIS — M2392 Unspecified internal derangement of left knee: Secondary | ICD-10-CM | POA: Diagnosis not present

## 2019-01-22 DIAGNOSIS — M6281 Muscle weakness (generalized): Secondary | ICD-10-CM | POA: Diagnosis not present

## 2019-01-22 DIAGNOSIS — M25362 Other instability, left knee: Secondary | ICD-10-CM | POA: Diagnosis not present

## 2019-01-22 DIAGNOSIS — M25562 Pain in left knee: Secondary | ICD-10-CM | POA: Diagnosis not present

## 2019-01-24 DIAGNOSIS — M2392 Unspecified internal derangement of left knee: Secondary | ICD-10-CM | POA: Diagnosis not present

## 2019-01-24 DIAGNOSIS — M6281 Muscle weakness (generalized): Secondary | ICD-10-CM | POA: Diagnosis not present

## 2019-01-24 DIAGNOSIS — M25562 Pain in left knee: Secondary | ICD-10-CM | POA: Diagnosis not present

## 2019-01-24 DIAGNOSIS — M25362 Other instability, left knee: Secondary | ICD-10-CM | POA: Diagnosis not present

## 2019-01-31 DIAGNOSIS — M25562 Pain in left knee: Secondary | ICD-10-CM | POA: Diagnosis not present

## 2019-01-31 DIAGNOSIS — M6281 Muscle weakness (generalized): Secondary | ICD-10-CM | POA: Diagnosis not present

## 2019-01-31 DIAGNOSIS — M2392 Unspecified internal derangement of left knee: Secondary | ICD-10-CM | POA: Diagnosis not present

## 2019-01-31 DIAGNOSIS — M25362 Other instability, left knee: Secondary | ICD-10-CM | POA: Diagnosis not present

## 2019-02-05 DIAGNOSIS — M6281 Muscle weakness (generalized): Secondary | ICD-10-CM | POA: Diagnosis not present

## 2019-02-05 DIAGNOSIS — M25362 Other instability, left knee: Secondary | ICD-10-CM | POA: Diagnosis not present

## 2019-02-05 DIAGNOSIS — M25562 Pain in left knee: Secondary | ICD-10-CM | POA: Diagnosis not present

## 2019-02-05 DIAGNOSIS — M2392 Unspecified internal derangement of left knee: Secondary | ICD-10-CM | POA: Diagnosis not present

## 2019-02-07 DIAGNOSIS — M6281 Muscle weakness (generalized): Secondary | ICD-10-CM | POA: Diagnosis not present

## 2019-02-07 DIAGNOSIS — M2392 Unspecified internal derangement of left knee: Secondary | ICD-10-CM | POA: Diagnosis not present

## 2019-02-07 DIAGNOSIS — M25362 Other instability, left knee: Secondary | ICD-10-CM | POA: Diagnosis not present

## 2019-02-07 DIAGNOSIS — M25562 Pain in left knee: Secondary | ICD-10-CM | POA: Diagnosis not present

## 2019-02-12 DIAGNOSIS — M2392 Unspecified internal derangement of left knee: Secondary | ICD-10-CM | POA: Diagnosis not present

## 2019-02-12 DIAGNOSIS — M25562 Pain in left knee: Secondary | ICD-10-CM | POA: Diagnosis not present

## 2019-02-12 DIAGNOSIS — M6281 Muscle weakness (generalized): Secondary | ICD-10-CM | POA: Diagnosis not present

## 2019-02-12 DIAGNOSIS — M25362 Other instability, left knee: Secondary | ICD-10-CM | POA: Diagnosis not present

## 2019-02-19 ENCOUNTER — Other Ambulatory Visit: Payer: Self-pay

## 2019-02-19 DIAGNOSIS — Z20822 Contact with and (suspected) exposure to covid-19: Secondary | ICD-10-CM

## 2019-02-19 DIAGNOSIS — R6889 Other general symptoms and signs: Secondary | ICD-10-CM | POA: Diagnosis not present

## 2019-02-21 DIAGNOSIS — M2392 Unspecified internal derangement of left knee: Secondary | ICD-10-CM | POA: Diagnosis not present

## 2019-02-21 DIAGNOSIS — M6281 Muscle weakness (generalized): Secondary | ICD-10-CM | POA: Diagnosis not present

## 2019-02-21 DIAGNOSIS — M25362 Other instability, left knee: Secondary | ICD-10-CM | POA: Diagnosis not present

## 2019-02-21 DIAGNOSIS — M25562 Pain in left knee: Secondary | ICD-10-CM | POA: Diagnosis not present

## 2019-02-21 LAB — NOVEL CORONAVIRUS, NAA: SARS-CoV-2, NAA: NOT DETECTED

## 2019-02-26 DIAGNOSIS — M2392 Unspecified internal derangement of left knee: Secondary | ICD-10-CM | POA: Diagnosis not present

## 2019-02-26 DIAGNOSIS — M25562 Pain in left knee: Secondary | ICD-10-CM | POA: Diagnosis not present

## 2019-02-26 DIAGNOSIS — M6281 Muscle weakness (generalized): Secondary | ICD-10-CM | POA: Diagnosis not present

## 2019-02-26 DIAGNOSIS — M25362 Other instability, left knee: Secondary | ICD-10-CM | POA: Diagnosis not present

## 2019-02-28 ENCOUNTER — Other Ambulatory Visit: Payer: Self-pay

## 2019-02-28 DIAGNOSIS — Z20822 Contact with and (suspected) exposure to covid-19: Secondary | ICD-10-CM

## 2019-03-01 LAB — NOVEL CORONAVIRUS, NAA: SARS-CoV-2, NAA: NOT DETECTED

## 2019-03-05 DIAGNOSIS — M25362 Other instability, left knee: Secondary | ICD-10-CM | POA: Diagnosis not present

## 2019-03-05 DIAGNOSIS — M25562 Pain in left knee: Secondary | ICD-10-CM | POA: Diagnosis not present

## 2019-03-05 DIAGNOSIS — M2392 Unspecified internal derangement of left knee: Secondary | ICD-10-CM | POA: Diagnosis not present

## 2019-03-05 DIAGNOSIS — M6281 Muscle weakness (generalized): Secondary | ICD-10-CM | POA: Diagnosis not present

## 2019-03-07 DIAGNOSIS — M25562 Pain in left knee: Secondary | ICD-10-CM | POA: Diagnosis not present

## 2019-03-07 DIAGNOSIS — M6281 Muscle weakness (generalized): Secondary | ICD-10-CM | POA: Diagnosis not present

## 2019-03-07 DIAGNOSIS — M2392 Unspecified internal derangement of left knee: Secondary | ICD-10-CM | POA: Diagnosis not present

## 2019-03-07 DIAGNOSIS — M25362 Other instability, left knee: Secondary | ICD-10-CM | POA: Diagnosis not present

## 2019-03-12 DIAGNOSIS — M6281 Muscle weakness (generalized): Secondary | ICD-10-CM | POA: Diagnosis not present

## 2019-03-12 DIAGNOSIS — M2392 Unspecified internal derangement of left knee: Secondary | ICD-10-CM | POA: Diagnosis not present

## 2019-03-12 DIAGNOSIS — M25362 Other instability, left knee: Secondary | ICD-10-CM | POA: Diagnosis not present

## 2019-03-12 DIAGNOSIS — M25562 Pain in left knee: Secondary | ICD-10-CM | POA: Diagnosis not present

## 2019-03-19 DIAGNOSIS — M6281 Muscle weakness (generalized): Secondary | ICD-10-CM | POA: Diagnosis not present

## 2019-03-19 DIAGNOSIS — M2392 Unspecified internal derangement of left knee: Secondary | ICD-10-CM | POA: Diagnosis not present

## 2019-03-19 DIAGNOSIS — M25362 Other instability, left knee: Secondary | ICD-10-CM | POA: Diagnosis not present

## 2019-03-19 DIAGNOSIS — M25562 Pain in left knee: Secondary | ICD-10-CM | POA: Diagnosis not present

## 2019-04-16 ENCOUNTER — Telehealth: Payer: Self-pay | Admitting: Internal Medicine

## 2019-04-16 DIAGNOSIS — I440 Atrioventricular block, first degree: Secondary | ICD-10-CM | POA: Diagnosis not present

## 2019-04-16 DIAGNOSIS — R0602 Shortness of breath: Secondary | ICD-10-CM | POA: Diagnosis not present

## 2019-04-16 DIAGNOSIS — R079 Chest pain, unspecified: Secondary | ICD-10-CM | POA: Diagnosis not present

## 2019-04-16 DIAGNOSIS — Z6825 Body mass index (BMI) 25.0-25.9, adult: Secondary | ICD-10-CM | POA: Diagnosis not present

## 2019-04-16 DIAGNOSIS — R0789 Other chest pain: Secondary | ICD-10-CM | POA: Diagnosis not present

## 2019-04-16 DIAGNOSIS — R001 Bradycardia, unspecified: Secondary | ICD-10-CM | POA: Diagnosis not present

## 2019-04-16 NOTE — Telephone Encounter (Signed)
Pt having chest pain with some difficulty breathing, has been going on for about two weeks on and off. Pt did mention she is under a lot of stress with work and kids, was advised by on on the CMA's over the phone to go in to the ER.

## 2019-04-20 ENCOUNTER — Other Ambulatory Visit: Payer: Self-pay

## 2019-04-20 DIAGNOSIS — R6889 Other general symptoms and signs: Secondary | ICD-10-CM | POA: Diagnosis not present

## 2019-04-20 DIAGNOSIS — Z20822 Contact with and (suspected) exposure to covid-19: Secondary | ICD-10-CM

## 2019-04-21 LAB — NOVEL CORONAVIRUS, NAA: SARS-CoV-2, NAA: NOT DETECTED

## 2019-06-04 ENCOUNTER — Ambulatory Visit (INDEPENDENT_AMBULATORY_CARE_PROVIDER_SITE_OTHER): Payer: BC Managed Care – PPO | Admitting: Internal Medicine

## 2019-06-04 ENCOUNTER — Encounter: Payer: Self-pay | Admitting: Internal Medicine

## 2019-06-04 ENCOUNTER — Other Ambulatory Visit: Payer: Self-pay

## 2019-06-04 VITALS — BP 117/76 | Temp 98.4°F | Ht 66.0 in | Wt 158.0 lb

## 2019-06-04 DIAGNOSIS — J0100 Acute maxillary sinusitis, unspecified: Secondary | ICD-10-CM

## 2019-06-04 MED ORDER — AMOXICILLIN-POT CLAVULANATE 875-125 MG PO TABS: 1.0000 | ORAL_TABLET | Freq: Two times a day (BID) | ORAL | 0 refills | Status: AC

## 2019-06-04 MED ORDER — IBUPROFEN 800 MG PO TABS: 800.0000 mg | ORAL_TABLET | Freq: Three times a day (TID) | ORAL | 1 refills | Status: AC | PRN

## 2019-06-04 NOTE — Progress Notes (Signed)
Date:  06/04/2019   Name:  Barbara Schmidt   DOB:  1980-08-20   MRN:  [id number]  I connected with this patient, Barbara Schmidt, by telephone at the patient's car (parked).  I verified that I am speaking with the correct person using two identifiers. This visit was conducted via telephone due to the Covid-19 outbreak from my office at [facility] in [place], [place]. I discussed the limitations, risks, security and privacy concerns of performing an evaluation and management service by telephone. I also discussed with the patient that there may be a patient responsible charge related to this service. The patient expressed understanding and agreed to proceed.  Chief Complaint: Cough (Started 3-4 days ago. Cough- green thick mucous, ear pressure- both ears, and sinus headache. Feels more in face than chest. )  Sinusitis This is a recurrent problem. The current episode started in the past 7 days. There has been no fever. The pain is mild. Associated symptoms include coughing, ear pain, headaches, sinus pressure and a sore throat. Pertinent negatives include no chills, diaphoresis, shortness of breath or swollen glands.  Cough This is a new problem. The current episode started in the past 7 days. The problem has been gradually worsening. The problem occurs every few minutes. The cough is productive of sputum. Associated symptoms include ear congestion, ear pain, headaches, myalgias, nasal congestion and a sore throat. Pertinent negatives include no chest pain, chills, fever, shortness of breath or wheezing. The symptoms are aggravated by pollens. She has tried OTC cough suppressant for the symptoms. The treatment provided no relief. Her past medical history is significant for environmental allergies.    @ Lab Results  Component Value Date   CREATININE 0.80 08/08/2018   BUN 11 08/08/2018   NA 139 08/08/2018   K 4.0 08/08/2018   CL 104 08/08/2018   CO2 23 08/08/2018   @ Lab Results   Component Value Date   CHOL 140 08/08/2018   HDL 63 08/08/2018   LDLCALC 63 08/08/2018   TRIG 68 08/08/2018   CHOLHDL 2.2 08/08/2018   @No  results found for: TSH @No  results found for: HGBA1C   Review of Systems  Constitutional: Negative for chills, diaphoresis, fever and unexpected weight change.  HENT: Positive for ear pain, sinus pressure and sore throat.   Eyes: Negative for visual disturbance.  Respiratory: Positive for cough. Negative for shortness of breath and wheezing.   Cardiovascular: Negative for chest pain and palpitations.  Musculoskeletal: Positive for myalgias.  Allergic/Immunologic: Positive for environmental allergies.  Neurological: Positive for headaches.    Patient Active Problem List   Diagnosis Date Noted  . Knee injury, initial encounter 10/13/2018  . 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 Antithrombin III deficiency ([place]) 07/27/2016  . Methylene THF reductase deficiency and homocystinuria ([place]) 07/27/2016  . Sebaceous cyst 07/27/2016  . Blood pressure elevated without history of HTN 01/13/2016  . Environmental and seasonal allergies 12/17/2015    No Known Allergies  Past Surgical History:  Procedure Laterality Date  . ARTHROSCOPIC REPAIR ACL Right    high school   . CESAREAN SECTION  09/2015   1 vaginal and 1 c section  . EVALUATION UNDER ANESTHESIA WITH HEMORRHOIDECTOMY N/A 08/25/2018   Procedure: EXAM UNDER ANESTHESIA WITH INTERNAL/EXTERNAL HEMORRHOIDECTOMY;  Surgeon: Herbert Pun, MD;  Location: [facility] ORS;  Service: General;  Laterality: N/A;    Social History   Tobacco Use  . Smoking status: Never Smoker  . Smokeless tobacco: Never Used  Substance  Use Topics  . Alcohol use: No  . Drug use: No     Medication list has been reviewed and updated.  Current Meds  Medication Sig  . acyclovir ointment (ZOVIRAX) 5 % APPLY 1 APPLICATION TOPICALLY EVERY 3 (THREE) HOURS.  Marland Kitchen aspirin-acetaminophen-caffeine (EXCEDRIN MIGRAINE) 250-250-65 MG  tablet Take 2 tablets by mouth 3 (three) times daily as needed for headache.  . drospirenone-ethinyl estradiol (NIKKI) 3-0.02 MG tablet Take 1 tablet by mouth daily.  Marland Kitchen ibuprofen (ADVIL,MOTRIN) 200 MG tablet Take 400 mg by mouth every 8 (eight) hours as needed (for pain.).    PHQ 2/9 Scores 06/04/2019 10/13/2018 01/16/2018 12/17/2015  PHQ - 2 Score 0 0 0 0  PHQ- 9 Score - - 0 -    BP Readings from Last 3 Encounters:  06/04/19 117/76  11/13/18 114/77  10/13/18 (!) 132/100    Physical Exam Pulmonary:     Comments: No cough or dyspnea noted during the call Neurological:     Mental Status: She is alert.  Psychiatric:        Attention and Perception: Attention normal.        Mood and Affect: Mood normal.        Speech: Speech normal.        Cognition and Memory: Cognition normal.     Wt Readings from Last 3 Encounters:  06/04/19 158 lb (71.7 kg)  11/13/18 164 lb (74.4 kg)  10/13/18 169 lb (76.7 kg)    BP 117/76   Temp 98.4 F (36.9 C) (Oral)   Ht 5\' 6"  (1.676 m)   Wt 158 lb (71.7 kg)   LMP 05/25/2019 (Exact Date)   BMI 25.50 kg/m   Assessment and Plan: 1. Acute maxillary sinusitis, recurrence not specified Continue Mucinex-D; increase fluids - amoxicillin-clavulanate (AUGMENTIN) 875-125 MG tablet; Take 1 tablet by mouth 2 (two) times daily for 10 days.  Dispense: 20 tablet; Refill: 0 - ibuprofen (ADVIL) 800 MG tablet; Take 1 tablet (800 mg total) by mouth every 8 (eight) hours as needed for mild pain or moderate pain.  Dispense: 60 tablet; Refill: 1  I spent 10 minutes on this encounter. Partially dictated using 05/27/2019. Any errors are unintentional.  Animal nutritionist, MD [facility]  06/04/2019

## 2019-06-06 DIAGNOSIS — Z1151 Encounter for screening for human papillomavirus (HPV): Secondary | ICD-10-CM | POA: Diagnosis not present

## 2019-06-06 DIAGNOSIS — Z01419 Encounter for gynecological examination (general) (routine) without abnormal findings: Secondary | ICD-10-CM | POA: Diagnosis not present

## 2019-06-06 DIAGNOSIS — Z23 Encounter for immunization: Secondary | ICD-10-CM | POA: Diagnosis not present

## 2019-06-06 DIAGNOSIS — Z124 Encounter for screening for malignant neoplasm of cervix: Secondary | ICD-10-CM | POA: Diagnosis not present

## 2019-06-06 DIAGNOSIS — Z0000 Encounter for general adult medical examination without abnormal findings: Secondary | ICD-10-CM | POA: Diagnosis not present

## 2019-06-12 ENCOUNTER — Other Ambulatory Visit: Payer: Self-pay | Admitting: Internal Medicine

## 2019-06-19 ENCOUNTER — Other Ambulatory Visit: Payer: Self-pay

## 2019-06-19 DIAGNOSIS — Z20822 Contact with and (suspected) exposure to covid-19: Secondary | ICD-10-CM

## 2019-06-21 LAB — NOVEL CORONAVIRUS, NAA: SARS-CoV-2, NAA: NOT DETECTED

## 2019-07-04 ENCOUNTER — Ambulatory Visit (INDEPENDENT_AMBULATORY_CARE_PROVIDER_SITE_OTHER): Payer: BC Managed Care – PPO | Admitting: Internal Medicine

## 2019-07-04 ENCOUNTER — Other Ambulatory Visit: Payer: Self-pay

## 2019-07-04 ENCOUNTER — Encounter: Payer: Self-pay | Admitting: Internal Medicine

## 2019-07-04 VITALS — BP 117/89 | Temp 98.6°F | Ht 66.0 in | Wt 158.0 lb

## 2019-07-04 DIAGNOSIS — B009 Herpesviral infection, unspecified: Secondary | ICD-10-CM | POA: Diagnosis not present

## 2019-07-04 DIAGNOSIS — J0100 Acute maxillary sinusitis, unspecified: Secondary | ICD-10-CM

## 2019-07-04 MED ORDER — DENAVIR 1 % EX CREA: 1.0000 "application " | TOPICAL_CREAM | CUTANEOUS | 0 refills | Status: AC

## 2019-07-04 MED ORDER — LEVOFLOXACIN 500 MG PO TABS: 500.0000 mg | ORAL_TABLET | Freq: Every day | ORAL | 0 refills | Status: AC

## 2019-07-04 MED ORDER — FLUTICASONE PROPIONATE 50 MCG/ACT NA SUSP: 2.0000 | Freq: Every day | NASAL | 6 refills | Status: AC

## 2019-07-04 NOTE — Progress Notes (Signed)
Date:  07/04/2019   Name:  Barbara Schmidt   DOB:  Feb 27, 1981   MRN:  [id number]  I connected with this patient, Barbara Schmidt, by telephone at the patient's home.  I verified that I am speaking with the correct person using two identifiers. This visit was conducted via telephone due to the Covid-19 outbreak from my office at [facility] in [place], [place]. I discussed the limitations, risks, security and privacy concerns of performing an evaluation and management service by telephone. I also discussed with the patient that there may be a patient responsible charge related to this service. The patient expressed understanding and agreed to proceed.  Chief Complaint: Cough (4 DAYS. Cough- green mucous. Sore throat, and ear pressure, pressure behind left eye, and headaches. No fever. )  Sinusitis This is a recurrent problem. The current episode started in the past 7 days. The problem has been gradually worsening since onset. There has been no fever. Associated symptoms include congestion, coughing, ear pain, headaches and sinus pressure. Pertinent negatives include no chills or shortness of breath. Past treatments include antibiotics (treated one month ago). The treatment provided moderate relief.    Lab Results  Component Value Date   CREATININE 0.80 08/08/2018   BUN 11 08/08/2018   NA 139 08/08/2018   K 4.0 08/08/2018   CL 104 08/08/2018   CO2 23 08/08/2018   Lab Results  Component Value Date   CHOL 140 08/08/2018   HDL 63 08/08/2018   LDLCALC 63 08/08/2018   TRIG 68 08/08/2018   CHOLHDL 2.2 08/08/2018   No results found for: TSH No results found for: HGBA1C   Review of Systems  Constitutional: Negative for chills, fatigue, fever and unexpected weight change.  HENT: Positive for congestion, ear pain, postnasal drip, sinus pressure and sinus pain. Negative for dental problem, ear discharge and trouble swallowing.   Respiratory: Positive for cough. Negative for chest  tightness, shortness of breath and wheezing.   Cardiovascular: Negative for chest pain and palpitations.  Gastrointestinal: Negative for abdominal pain, diarrhea and nausea.  Neurological: Positive for headaches. Negative for dizziness.  Psychiatric/Behavioral: Negative for dysphoric mood and sleep disturbance. The patient is not nervous/anxious.     Patient Active Problem List   Diagnosis Date Noted  . Knee injury, initial encounter 10/13/2018  . 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 Sebaceous cyst 07/27/2016  . Blood pressure elevated without history of HTN 01/13/2016  . Environmental and seasonal allergies 12/17/2015    No Known Allergies  Past Surgical History:  Procedure Laterality Date  . ARTHROSCOPIC REPAIR ACL Right    high school   . CESAREAN SECTION  09/2015   1 vaginal and 1 c section  . EVALUATION UNDER ANESTHESIA WITH HEMORRHOIDECTOMY N/A 08/25/2018   Procedure: EXAM UNDER ANESTHESIA WITH INTERNAL/EXTERNAL HEMORRHOIDECTOMY;  Surgeon: Carolan Shiver, MD;  Location: [facility] ORS;  Service: General;  Laterality: N/A;    Social History   Tobacco Use  . Smoking status: Never Smoker  . Smokeless tobacco: Never Used  Substance Use Topics  . Alcohol use: No  . Drug use: No     Medication list has been reviewed and updated.  Current Meds  Medication Sig  . acyclovir ointment (ZOVIRAX) 5 % APPLY 1 APPLICATION TOPICALLY EVERY 3 (THREE) HOURS.  Marland Kitchen aspirin-acetaminophen-caffeine (EXCEDRIN MIGRAINE) 250-250-65 MG tablet Take 2 tablets by mouth 3 (three) times daily  as needed for headache.  . drospirenone-ethinyl estradiol (NIKKI) 3-0.02 MG tablet Take 1 tablet by mouth daily.  Marland Kitchen ibuprofen (ADVIL) 800 MG tablet Take 1 tablet (800 mg total) by mouth every 8 (eight) hours as needed for mild pain or moderate pain.    PHQ 2/9 Scores 07/04/2019 06/04/2019  10/13/2018 01/16/2018  PHQ - 2 Score 0 0 0 0  PHQ- 9 Score - - - 0    BP Readings from Last 3 Encounters:  07/04/19 117/89  06/04/19 117/76  11/13/18 114/77    Physical Exam Pulmonary:     Comments: No cough or dyspnea noted during the call Neurological:     Mental Status: She is alert.  Psychiatric:        Attention and Perception: Attention normal.        Mood and Affect: Mood normal.        Speech: Speech normal.        Cognition and Memory: Cognition normal.     Wt Readings from Last 3 Encounters:  07/04/19 158 lb (71.7 kg)  06/04/19 158 lb (71.7 kg)  11/13/18 164 lb (74.4 kg)    BP 117/89   Temp 98.6 F (37 C) (Oral)   Ht 5\' 6"  (1.676 m)   Wt 158 lb (71.7 kg)   BMI 25.50 kg/m   Assessment and Plan: 1. Acute maxillary sinusitis, recurrence not specified Recommend resuming Flonase spray daily If recurrent, would refer to ENT for further evaluation - levofloxacin (LEVAQUIN) 500 MG tablet; Take 1 tablet (500 mg total) by mouth daily for 7 days.  Dispense: 7 tablet; Refill: 0 - fluticasone (FLONASE) 50 MCG/ACT nasal spray; Place 2 sprays into both nostrils daily.  Dispense: 16 g; Refill: 6  2. HSV (herpes simplex virus) infection - penciclovir (DENAVIR) 1 % cream; Apply 1 application topically every 2 (two) hours.  Dispense: 1.5 g; Refill: 0   Partially dictated using Editor, commissioning. Any errors are unintentional.  Halina Maidens, MD [facility]  07/04/2019

## 2019-08-20 ENCOUNTER — Telehealth: Payer: Self-pay | Admitting: Internal Medicine

## 2019-08-20 ENCOUNTER — Ambulatory Visit: Payer: BC Managed Care – PPO | Admitting: Internal Medicine

## 2019-08-20 NOTE — Telephone Encounter (Signed)
Patient was seen at the [place] ER for chest pain and they told her it could be related to a muscle she also mention that it seems to be worsening.

## 2019-08-20 NOTE — Telephone Encounter (Signed)
Please schedule pt at 320 this afternoon to be evaluated. Thank you.

## 2019-08-21 ENCOUNTER — Encounter: Payer: Self-pay | Admitting: Internal Medicine

## 2019-08-21 ENCOUNTER — Ambulatory Visit (INDEPENDENT_AMBULATORY_CARE_PROVIDER_SITE_OTHER): Payer: BC Managed Care – PPO | Admitting: Internal Medicine

## 2019-08-21 ENCOUNTER — Other Ambulatory Visit: Payer: Self-pay

## 2019-08-21 VITALS — BP 110/86 | HR 76 | Temp 98.2°F | Ht 66.0 in | Wt 170.0 lb

## 2019-08-21 DIAGNOSIS — M940 Chondrocostal junction syndrome [Tietze]: Secondary | ICD-10-CM

## 2019-08-21 MED ORDER — CYCLOBENZAPRINE HCL 10 MG PO TABS: 10.0000 mg | ORAL_TABLET | Freq: Every day | ORAL | 0 refills | Status: AC

## 2019-08-21 MED ORDER — PREDNISONE 10 MG PO TABS: ORAL_TABLET | ORAL | 0 refills | Status: AC

## 2019-08-21 NOTE — Patient Instructions (Signed)
Costochondritis  Costochondritis is swelling and irritation (inflammation) of the tissue (cartilage) that connects your ribs to your breastbone (sternum). This causes pain in the front of your chest. The pain usually starts gradually and involves more than one rib. What are the causes? The exact cause of this condition is not always known. It results from stress on the cartilage where your ribs attach to your sternum. The cause of this stress could be:  Chest injury (trauma).  Exercise or activity, such as lifting.  Severe coughing. What increases the risk? You may be at higher risk for this condition if you:  Are female.  Are 30?37 years old.  Recently started a new exercise or work activity.  Have low levels of vitamin D.  Have a condition that makes you cough frequently. What are the signs or symptoms? The main symptom of this condition is chest pain. The pain:  Usually starts gradually and can be sharp or dull.  Gets worse with deep breathing, coughing, or exercise.  Gets better with rest.  May be worse when you press on the sternum-rib connection (tenderness). How is this diagnosed? This condition is diagnosed based on your symptoms, medical history, and a physical exam. Your health care provider will check for tenderness when pressing on your sternum. This is the most important finding. You may also have tests to rule out other causes of chest pain. These may include:  A chest X-ray to check for lung problems.  An electrocardiogram (ECG) to see if you have a heart problem that could be causing the pain.  An imaging scan to rule out a chest or rib fracture. How is this treated? This condition usually goes away on its own over time. Your health care provider may prescribe an NSAID to reduce pain and inflammation. Your health care provider may also suggest that you:  Rest and avoid activities that make pain worse.  Apply heat or cold to the area to reduce pain and  inflammation.  Do exercises to stretch your chest muscles. If these treatments do not help, your health care provider may inject a numbing medicine at the sternum-rib connection to help relieve the pain. Follow these instructions at home:  Avoid activities that make pain worse. This includes any activities that use chest, abdominal, and side muscles.  If directed, put ice on the painful area: ? Put ice in a plastic bag. ? Place a towel between your skin and the bag. ? Leave the ice on for 20 minutes, 2-3 times a day.  If directed, apply heat to the affected area as often as told by your health care provider. Use the heat source that your health care provider recommends, such as a moist heat pack or a heating pad. ? Place a towel between your skin and the heat source. ? Leave the heat on for 20-30 minutes. ? Remove the heat if your skin turns bright red. This is especially important if you are unable to feel pain, heat, or cold. You may have a greater risk of getting burned.  Take over-the-counter and prescription medicines only as told by your health care provider.  Return to your normal activities as told by your health care provider. Ask your health care provider what activities are safe for you.  Keep all follow-up visits as told by your health care provider. This is important. Contact a health care provider if:  You have chills or a fever.  Your pain does not go away or it gets   worse.  You have a cough that does not go away (is persistent). Get help right away if:  You have shortness of breath. This information is not intended to replace advice given to you by your health care provider. Make sure you discuss any questions you have with your health care provider. Document Revised: 07/27/2017 Document Reviewed: 11/05/2015 Elsevier Patient Education  2020 [employer]  

## 2019-08-21 NOTE — Progress Notes (Signed)
Date:  08/21/2019   Name:  Barbara Schmidt   DOB:  22-Oct-1980   MRN:  [id number]   Chief Complaint: Chest Pain (Seen [facility] in September and pain is not any better. Heart came back fine. On and off. Dull and cannot lift when the pain comes. Hurts behind left breast. )  Chest Pain  This is a chronic problem. The current episode started more than 1 month ago. The onset quality is undetermined. The problem occurs every several days. The problem has been unchanged. The pain is present in the lateral region. The pain is moderate. The quality of the pain is described as sharp. The pain radiates to the left shoulder. Pertinent negatives include no dizziness, fever, headaches, palpitations or shortness of breath. The pain is aggravated by breathing, coughing and movement. She has tried NSAIDs for the symptoms. The treatment provided mild relief. Prior diagnostic workup includes chest x-ray and echocardiogram (normal in September at [facility]).    Lab Results  Component Value Date   CREATININE 0.80 08/08/2018   BUN 11 08/08/2018   NA 139 08/08/2018   K 4.0 08/08/2018   CL 104 08/08/2018   CO2 23 08/08/2018   Lab Results  Component Value Date   CHOL 140 08/08/2018   HDL 63 08/08/2018   LDLCALC 63 08/08/2018   TRIG 68 08/08/2018   CHOLHDL 2.2 08/08/2018   No results found for: TSH No results found for: HGBA1C   Review of Systems  Constitutional: Negative for chills, fatigue and fever.  Respiratory: Negative for choking, chest tightness and shortness of breath.   Cardiovascular: Positive for chest pain. Negative for palpitations and leg swelling.  Musculoskeletal: Positive for arthralgias and neck stiffness.  Neurological: Negative for dizziness and headaches.    Patient Active Problem List   Diagnosis Date Noted  . Knee injury, initial encounter 10/13/2018  . 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 Sebaceous cyst 07/27/2016  . Blood pressure elevated without history of HTN 01/13/2016  . Environmental and seasonal allergies 12/17/2015    No Known Allergies  Past Surgical History:  Procedure Laterality Date  . ARTHROSCOPIC REPAIR ACL Right    high school   . CESAREAN SECTION  09/2015   1 vaginal and 1 c section  . EVALUATION UNDER ANESTHESIA WITH HEMORRHOIDECTOMY N/A 08/25/2018   Procedure: EXAM UNDER ANESTHESIA WITH INTERNAL/EXTERNAL HEMORRHOIDECTOMY;  Surgeon: Herbert Pun, MD;  Location: [facility] ORS;  Service: General;  Laterality: N/A;    Social History   Tobacco Use  . Smoking status: Never Smoker  . Smokeless tobacco: Never Used  Substance Use Topics  . Alcohol use: No  . Drug use: No     Medication list has been reviewed and updated.  Current Meds  Medication Sig  . acyclovir ointment (ZOVIRAX) 5 % APPLY 1 APPLICATION TOPICALLY EVERY 3 (THREE) HOURS.  Marland Kitchen aspirin-acetaminophen-caffeine (EXCEDRIN MIGRAINE) 250-250-65 MG tablet Take 2 tablets by mouth 3 (three) times daily as needed for headache.  . drospirenone-ethinyl estradiol (NIKKI) 3-0.02 MG tablet Take 1 tablet by mouth daily.  . fluticasone (FLONASE) 50 MCG/ACT nasal spray Place 2 sprays into both nostrils daily.  Marland Kitchen ibuprofen (ADVIL) 800 MG tablet Take 1 tablet (800 mg total) by mouth every 8 (eight) hours as needed for mild pain or moderate pain.  Marland Kitchen penciclovir (DENAVIR) 1 % cream Apply 1 application topically every 2 (two) hours.  PHQ 2/9 Scores 08/21/2019 07/04/2019 06/04/2019 10/13/2018  PHQ - 2 Score 0 0 0 0  PHQ- 9 Score - - - -    BP Readings from Last 3 Encounters:  08/21/19 110/86  07/04/19 117/89  06/04/19 117/76    Physical Exam Vitals and nursing note reviewed.  Constitutional:      General: She is not in acute distress.    Appearance: She is well-developed.  HENT:     Head: Normocephalic and atraumatic.  Cardiovascular:     Heart sounds:  Normal heart sounds. No murmur.  Pulmonary:     Effort: Pulmonary effort is normal. No respiratory distress.     Breath sounds: Normal breath sounds. No decreased breath sounds, wheezing or rhonchi.  Chest:     Chest wall: Tenderness (along left sternum) present. No mass.  Musculoskeletal:     Cervical back: Tenderness present. Decreased range of motion.  Skin:    General: Skin is warm and dry.     Findings: No rash.  Neurological:     Mental Status: She is alert and oriented to person, place, and time.  Psychiatric:        Behavior: Behavior normal.        Thought Content: Thought content normal.     Wt Readings from Last 3 Encounters:  08/21/19 170 lb (77.1 kg)  07/04/19 158 lb (71.7 kg)  06/04/19 158 lb (71.7 kg)    BP 110/86   Pulse 76   Temp 98.2 F (36.8 C) (Oral)   Ht 5\' 6"  (1.676 m)   Wt 170 lb (77.1 kg)   LMP 08/13/2019 (Within Days)   SpO2 99%   BMI 27.44 kg/m   Assessment and Plan: 1. Costochondritis Persistent pain due to inadequate treatment Will treat with steroid taper and muscle relaxants, heat Pt instructed in splinting for cough or sneeze Avoid strenuous activity until taper complete - predniSONE (DELTASONE) 10 MG tablet; Take 6 on day 1and 2, 5 on day 3 and 4, 4 on day 5 and 6 , 3 on day 7 and 8, 2 on day 9 and 10 and 1 on day 11 and 12 then stop.  Dispense: 42 tablet; Refill: 0 - cyclobenzaprine (FLEXERIL) 10 MG tablet; Take 1 tablet (10 mg total) by mouth at bedtime.  Dispense: 30 tablet; Refill: 0   Partially dictated using 08/15/2019. Any errors are unintentional.  Animal nutritionist, MD [facility]  08/21/2019

## 2019-08-31 DIAGNOSIS — H9190 Unspecified hearing loss, unspecified ear: Secondary | ICD-10-CM | POA: Diagnosis not present

## 2019-08-31 DIAGNOSIS — G501 Atypical facial pain: Secondary | ICD-10-CM | POA: Diagnosis not present

## 2019-08-31 DIAGNOSIS — M26629 Arthralgia of temporomandibular joint, unspecified side: Secondary | ICD-10-CM | POA: Diagnosis not present

## 2019-09-19 DIAGNOSIS — Z23 Encounter for immunization: Secondary | ICD-10-CM | POA: Diagnosis not present

## 2019-10-22 DIAGNOSIS — Z23 Encounter for immunization: Secondary | ICD-10-CM | POA: Diagnosis not present

## 2021-01-18 IMAGING — MR MRI OF THE LEFT KNEE WITHOUT CONTRAST
7 series · 40 of 40 positions shown · non-contrast
Comparison: None.

CLINICAL DATA: Left knee pain and instability following injury
playing basketball 3 months ago. Patient felt a pop. No previous
relevant surgery.

EXAM:
MRI OF THE LEFT KNEE WITHOUT CONTRAST
TECHNIQUE: Multiplanar, multisequence MR imaging of the knee was performed. No
intravenous contrast was administered.

[Series 8: T2 fat-sat · axial · left · 4.0mm · 0.50mm/px · z∈[-36,+96]mm · 6 of 28 slices shown (1 of 3)]
[im 1/28]
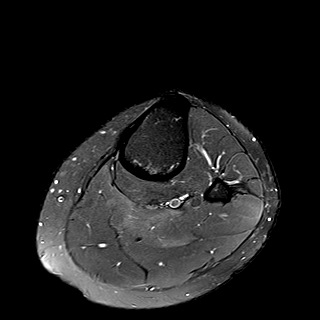
[im 6/28]
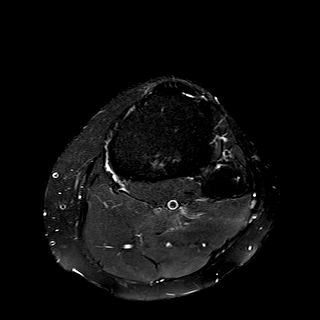
[im 11/28]
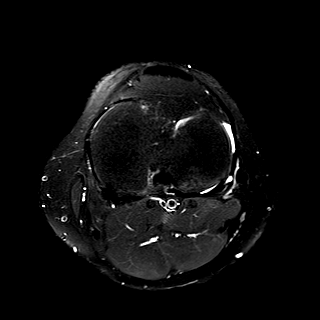
[im 17/28]
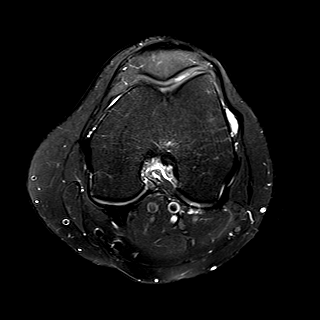
[im 22/28]
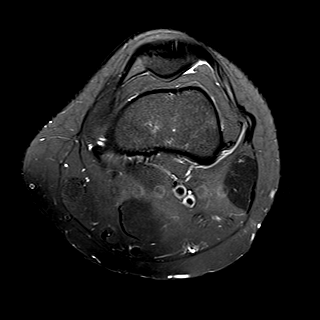
[im 28/28]
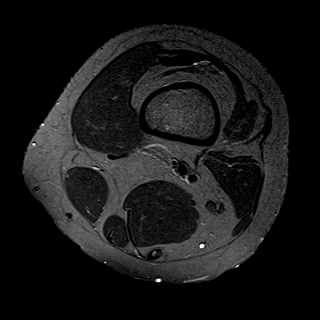

[Series 9: T1 · coronal · left · 4.0mm · 0.59mm/px · 6 of 28 slices shown]
[im 1/28]
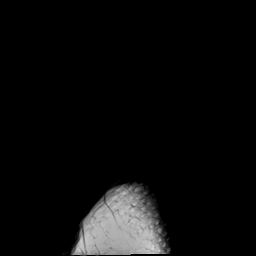
[im 6/28]
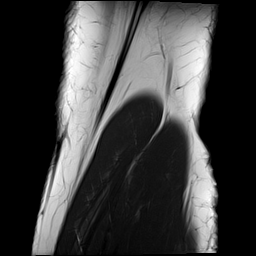
[im 11/28]
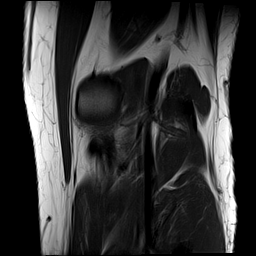
[im 17/28]
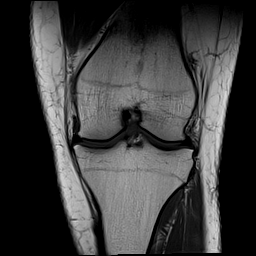
[im 22/28]
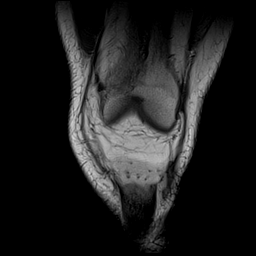
[im 28/28]
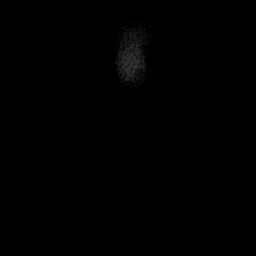

[Series 10: T2 fat-sat · coronal · left · 4.0mm · 0.59mm/px · 6 of 28 slices shown (2 of 3)]
[im 1/28]
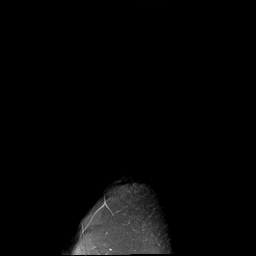
[im 6/28]
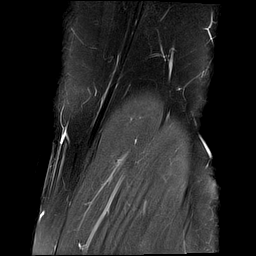
[im 11/28]
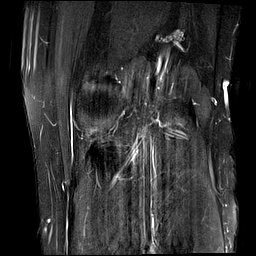
[im 17/28]
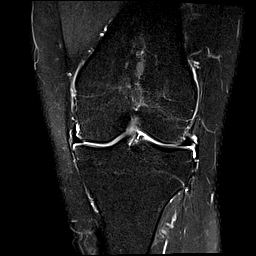
[im 22/28]
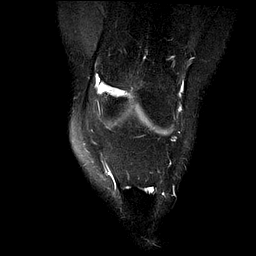
[im 28/28]
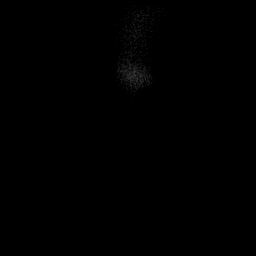

[Series 11: PD fat-sat · sagittal · left · 3.0mm · 0.59mm/px · 7 of 32 slices shown (1 of 2)]
[im 1/32]
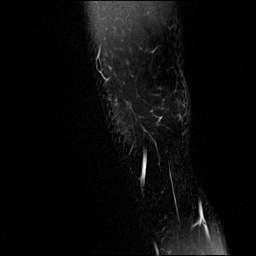
[im 6/32]
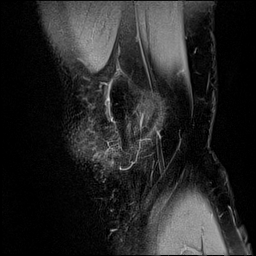
[im 11/32]
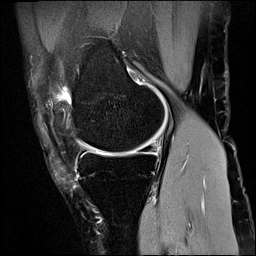
[im 16/32]
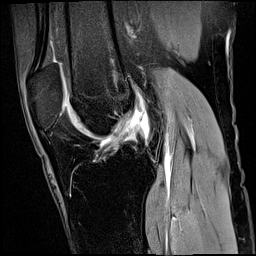
[im 21/32]
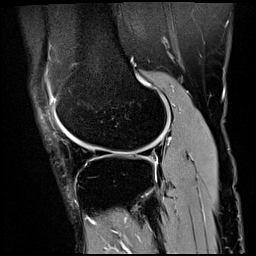
[im 26/32]
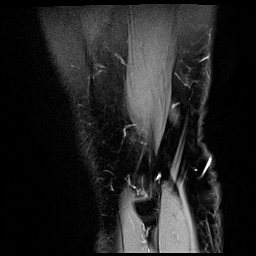
[im 32/32]
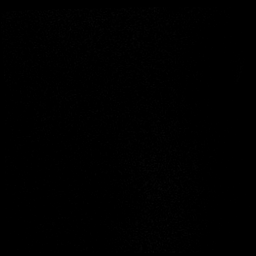

[Series 12: PD fat-sat · coronal · left · 4.0mm · 0.59mm/px · 6 of 28 slices shown (2 of 2)]
[im 1/28]
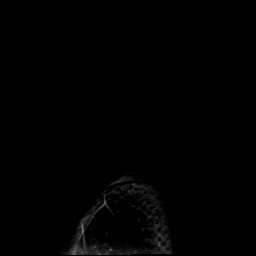
[im 6/28]
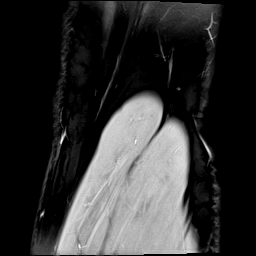
[im 11/28]
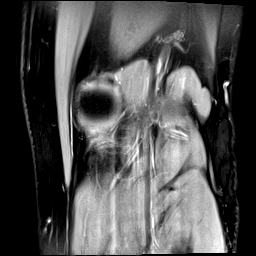
[im 17/28]
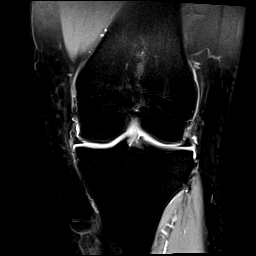
[im 22/28]
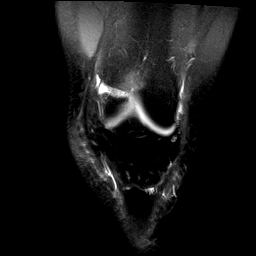
[im 28/28]
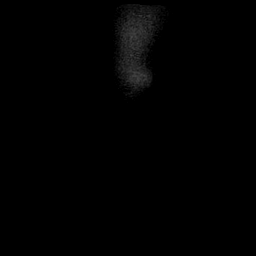

[Series 13: T2 fat-sat · sagittal · left · 3.0mm · 0.59mm/px · 7 of 34 slices shown (3 of 3)]
[im 1/34]
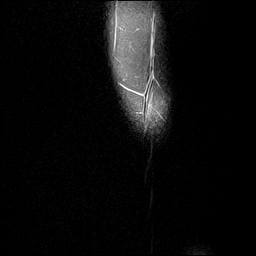
[im 6/34]
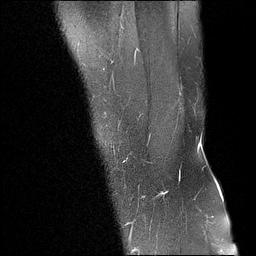
[im 12/34]
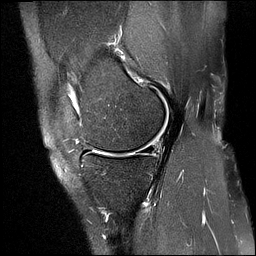
[im 17/34]
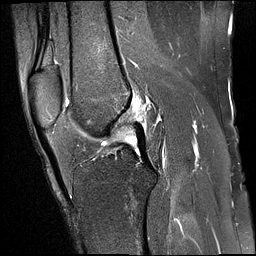
[im 23/34]
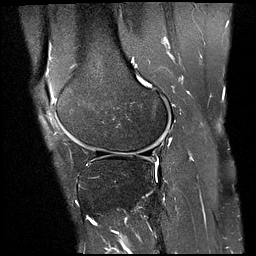
[im 28/34]
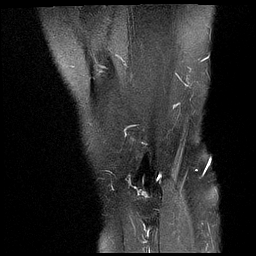
[im 34/34]
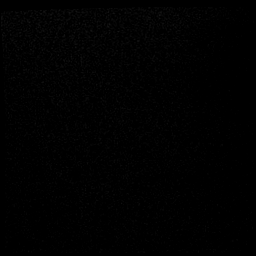

[Series 14: PD · oblique · left · 2.0mm · 0.47mm/px · 2 of 10 slices shown]
[im 1/10]
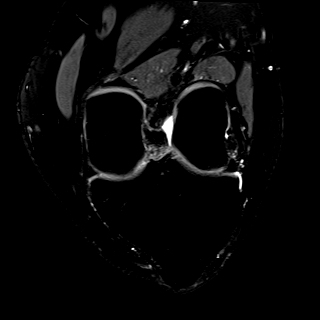
[im 10/10]
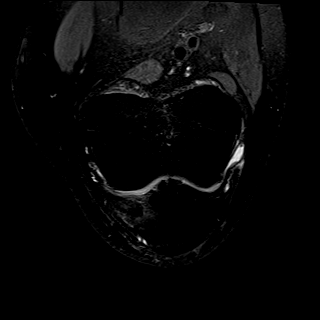

[40 of 40 positions shown; findings below may reference images not displayed]

FINDINGS: MENISCI

Medial meniscus:  Intact with normal morphology.

Lateral meniscus:  Intact with normal morphology.

LIGAMENTS

Cruciates: The anterior cruciate ligament appears indistinct and
edematous on the sagittal and coronal images. There may be a few
intact fibers on the axial images. The PCL is intact.

Collaterals:  Intact.

CARTILAGE

Patellofemoral:  Preserved.

Medial:  Preserved.

Lateral:  Preserved.

MISCELLANEOUS

Joint:  No significant joint effusion.

Popliteal Fossa:  Unremarkable. No significant Baker's cyst.

Extensor Mechanism:  Intact.

Bones: There is minimal marrow edema posteriorly in the lateral
tibial plateau. No other bone marrow signal abnormalities are
identified. There is no cortical fracture.

Other: No periarticular soft tissue edema or focal fluid collection.
IMPRESSION: 1. Indistinct, edematous anterior cruciate ligament, suspicious for
at least partial tearing. This may be subacute as suggested by
history.
2. Possible healing bone contusion of the lateral tibial plateau
posteriorly. No focal chondral defect.
3. Intact menisci, collateral ligaments and PCL.

## 2021-04-21 ENCOUNTER — Encounter: Payer: Self-pay | Admitting: General Surgery
